# Patient Record
Sex: Female | Born: 1955 | Race: Black or African American | Hispanic: No | Marital: Single | Smoking: Never smoker
Health system: Southern US, Community
[De-identification: ages and names within clinical notes are randomized; demographics above are authoritative.]

## PROBLEM LIST (undated history)

## (undated) DIAGNOSIS — K219 Gastro-esophageal reflux disease without esophagitis: Secondary | ICD-10-CM

## (undated) DIAGNOSIS — E785 Hyperlipidemia, unspecified: Secondary | ICD-10-CM

## (undated) DIAGNOSIS — T7840XA Allergy, unspecified, initial encounter: Secondary | ICD-10-CM

## (undated) DIAGNOSIS — Z22322 Carrier or suspected carrier of Methicillin resistant Staphylococcus aureus: Secondary | ICD-10-CM

## (undated) DIAGNOSIS — C801 Malignant (primary) neoplasm, unspecified: Secondary | ICD-10-CM

## (undated) DIAGNOSIS — I10 Essential (primary) hypertension: Secondary | ICD-10-CM

## (undated) DIAGNOSIS — Z973 Presence of spectacles and contact lenses: Secondary | ICD-10-CM

## (undated) DIAGNOSIS — E669 Obesity, unspecified: Secondary | ICD-10-CM

## (undated) DIAGNOSIS — Z01419 Encounter for gynecological examination (general) (routine) without abnormal findings: Secondary | ICD-10-CM

## (undated) DIAGNOSIS — Z9289 Personal history of other medical treatment: Secondary | ICD-10-CM

## (undated) DIAGNOSIS — E559 Vitamin D deficiency, unspecified: Secondary | ICD-10-CM

## (undated) DIAGNOSIS — Z972 Presence of dental prosthetic device (complete) (partial): Secondary | ICD-10-CM

## (undated) DIAGNOSIS — D509 Iron deficiency anemia, unspecified: Secondary | ICD-10-CM

## (undated) HISTORY — DX: Vitamin D deficiency, unspecified: E55.9

## (undated) HISTORY — DX: Hyperlipidemia, unspecified: E78.5

## (undated) HISTORY — DX: Presence of spectacles and contact lenses: Z97.3

## (undated) HISTORY — PX: OOPHORECTOMY: SHX86

## (undated) HISTORY — PX: TUBAL LIGATION: SHX77

## (undated) HISTORY — DX: Allergy, unspecified, initial encounter: T78.40XA

## (undated) HISTORY — DX: Personal history of other medical treatment: Z92.89

## (undated) HISTORY — DX: Encounter for gynecological examination (general) (routine) without abnormal findings: Z01.419

## (undated) HISTORY — DX: Obesity, unspecified: E66.9

## (undated) HISTORY — DX: Presence of dental prosthetic device (complete) (partial): Z97.2

## (undated) HISTORY — DX: Gastro-esophageal reflux disease without esophagitis: K21.9

## (undated) HISTORY — DX: Essential (primary) hypertension: I10

---

## 2003-12-07 DIAGNOSIS — I10 Essential (primary) hypertension: Secondary | ICD-10-CM

## 2003-12-07 HISTORY — DX: Essential (primary) hypertension: I10

## 2011-09-14 ENCOUNTER — Emergency Department (HOSPITAL_COMMUNITY)
Admission: EM | Admit: 2011-09-14 | Discharge: 2011-09-14 | Disposition: A | Discharge status: Home / Self Care | Payer: No Typology Code available for payment source | Attending: Emergency Medicine | Admitting: Emergency Medicine

## 2011-09-14 ENCOUNTER — Emergency Department (HOSPITAL_COMMUNITY): Payer: No Typology Code available for payment source

## 2011-09-14 DIAGNOSIS — Z79899 Other long term (current) drug therapy: Secondary | ICD-10-CM | POA: Insufficient documentation

## 2011-09-14 DIAGNOSIS — K219 Gastro-esophageal reflux disease without esophagitis: Secondary | ICD-10-CM | POA: Insufficient documentation

## 2011-09-14 DIAGNOSIS — I10 Essential (primary) hypertension: Secondary | ICD-10-CM | POA: Insufficient documentation

## 2011-09-14 DIAGNOSIS — M542 Cervicalgia: Secondary | ICD-10-CM | POA: Insufficient documentation

## 2011-09-14 DIAGNOSIS — R51 Headache: Secondary | ICD-10-CM | POA: Insufficient documentation

## 2011-09-14 DIAGNOSIS — M25519 Pain in unspecified shoulder: Secondary | ICD-10-CM | POA: Insufficient documentation

## 2011-09-14 DIAGNOSIS — E7800 Pure hypercholesterolemia, unspecified: Secondary | ICD-10-CM | POA: Insufficient documentation
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
## 2012-01-07 HISTORY — PX: COLONOSCOPY: SHX174

## 2012-01-11 LAB — HM COLONOSCOPY

## 2014-01-16 ENCOUNTER — Encounter: Payer: Self-pay | Admitting: Medical

## 2014-01-16 ENCOUNTER — Ambulatory Visit (INDEPENDENT_AMBULATORY_CARE_PROVIDER_SITE_OTHER): Payer: 59 | Admitting: Medical

## 2014-01-16 VITALS — BP 102/70 | HR 60 | Temp 98.3°F | Resp 16 | Wt 194.0 lb

## 2014-01-16 DIAGNOSIS — J209 Acute bronchitis, unspecified: Secondary | ICD-10-CM

## 2014-01-16 DIAGNOSIS — H9209 Otalgia, unspecified ear: Secondary | ICD-10-CM

## 2014-01-16 DIAGNOSIS — H9201 Otalgia, right ear: Secondary | ICD-10-CM

## 2014-01-16 MED ORDER — AMOXICILLIN-POT CLAVULANATE 875-125 MG PO TABS: 1.0000 | ORAL_TABLET | Freq: Two times a day (BID) | ORAL | Status: DC

## 2014-01-16 MED ORDER — ANTIPYRINE-BENZOCAINE 5.4-1.4 % OT SOLN: 3.0000 [drp] | OTIC | Status: DC | PRN

## 2014-01-16 MED ORDER — BENZONATATE 200 MG PO CAPS: 200.0000 mg | ORAL_CAPSULE | Freq: Three times a day (TID) | ORAL | Status: DC | PRN

## 2014-01-16 NOTE — Progress Notes (Signed)
Subjective:  Megan Jordan is a 58 y.o. female who presents for possible bronchitis.  New patient today.  Symptoms include several day hx/o right ear pain, chest congestion, lots of cough, burning in chest, chills, fatigue, runny nose, swollen glands, headache.   Denies fever, NVD, SOB, wheezing, hemoptysis, sore throat.  Treatment to date: Robitussin.  +sick contacts.   She does not smoke.   She does have a history of bronchitis.   No other aggravating or relieving factors.  No other c/o.  The following portions of the patient's history were reviewed and updated as appropriate: allergies, current medications, past family history, past medical history, past social history, past surgical history and problem list.  ROS as in subjective   Objective: BP 102/70  Pulse 60  Temp(Src) 98.3 F (36.8 C) (Oral)  Resp 16  Wt 194 lb (87.998 kg)   General appearance: Alert, WD/WN, no distress                             Skin: warm, no rash, no diaphoresis                           Head: mild frontal sinus tenderness                            Eyes: conjunctiva normal, corneas clear, PERRLA                            Ears:flat TMs,no erythema, external ear canals normal                          Nose: septum midline, turbinates swollen, with erythema and clear discharge             Mouth/throat: MMM, tongue normal, mild pharyngeal erythema                           Neck: supple, no adenopathy, no thyromegaly, nontender                          Heart: RRR, normal S1, S2, no murmurs                         Lungs: +bronchial breath sounds, few rhonchi, no wheezes, no rales                Extremities: no edema, nontender, no palpable cord or calve tenderness     Assessment: Encounter Diagnoses  Name Primary?  . Acute bronchitis Yes  . Otalgia of right ear     Plan:   [name] was seen today for otalgia and possible bronchitis.  Diagnoses and associated orders for this visit:  Acute  bronchitis  Otalgia of right ear  Other Orders - antipyrine-benzocaine (AURALGAN) otic solution; Place 3-4 drops into both ears every 2 (two) hours as needed for ear pain. - benzonatate (TESSALON) 200 MG capsule; Take 1 capsule (200 mg total) by mouth 3 (three) times daily as needed for cough. - amoxicillin-clavulanate (AUGMENTIN) 875-125 MG per tablet; Take 1 tablet by mouth 2 (two) times daily.    Discussed diagnosis and treatment.  Suggested symptomatic OTC remedies for cough and congestion.  Tylenol or  Ibuprofen OTC for fever and malaise.  Call/return in 2-3 days if symptoms are worse or not improving.  Advised that cough may linger even after the infection is improved.

## 2014-01-17 ENCOUNTER — Telehealth: Payer: Self-pay | Admitting: Medical

## 2014-01-17 NOTE — Telephone Encounter (Signed)
lm

## 2014-02-05 ENCOUNTER — Encounter: Payer: 59 | Admitting: Medical

## 2014-03-19 ENCOUNTER — Encounter: Payer: 59 | Admitting: Medical

## 2014-03-26 ENCOUNTER — Encounter: Payer: Self-pay | Admitting: Medical

## 2014-03-26 ENCOUNTER — Telehealth: Payer: Self-pay | Admitting: Internal Medicine

## 2014-03-26 ENCOUNTER — Ambulatory Visit (INDEPENDENT_AMBULATORY_CARE_PROVIDER_SITE_OTHER): Payer: 59 | Admitting: Medical

## 2014-03-26 VITALS — BP 110/70 | HR 60 | Temp 98.0°F | Resp 16 | Ht 67.0 in | Wt 195.0 lb

## 2014-03-26 DIAGNOSIS — I10 Essential (primary) hypertension: Secondary | ICD-10-CM

## 2014-03-26 DIAGNOSIS — E785 Hyperlipidemia, unspecified: Secondary | ICD-10-CM

## 2014-03-26 DIAGNOSIS — K219 Gastro-esophageal reflux disease without esophagitis: Secondary | ICD-10-CM

## 2014-03-26 DIAGNOSIS — Z0000 Encounter for general adult medical examination without abnormal findings: Secondary | ICD-10-CM

## 2014-03-26 DIAGNOSIS — E669 Obesity, unspecified: Secondary | ICD-10-CM

## 2014-03-26 LAB — POCT URINALYSIS DIPSTICK
Bilirubin, UA: NEGATIVE
Blood, UA: NEGATIVE
Glucose, UA: NEGATIVE
Ketones, UA: NEGATIVE
Leukocytes, UA: NEGATIVE
Nitrite, UA: NEGATIVE
Protein, UA: NEGATIVE
Spec Grav, UA: <=1.005
Urobilinogen, UA: NEGATIVE
pH, UA: 5

## 2014-03-26 LAB — CBC
HCT: 34.8 % — ABNORMAL LOW (ref 36.0–46.0)
Hemoglobin: 11.8 g/dL — ABNORMAL LOW (ref 12.0–15.0)
MCH: 28.1 pg (ref 26.0–34.0)
MCHC: 33.9 g/dL (ref 30.0–36.0)
MCV: 82.9 fL (ref 78.0–100.0)
Platelets: 232 10*3/uL (ref 150–400)
RBC: 4.2 MIL/uL (ref 3.87–5.11)
RDW: 13.8 % (ref 11.5–15.5)
WBC: 6.1 10*3/uL (ref 4.0–10.5)

## 2014-03-26 LAB — LIPID PANEL
Cholesterol: 152 mg/dL (ref 0–200)
HDL: 67 mg/dL (ref >39)
LDL Cholesterol: 75 mg/dL (ref 0–99)
Total CHOL/HDL Ratio: 2.3 Ratio
Triglycerides: 50 mg/dL (ref <150)
VLDL: 10 mg/dL (ref 0–40)

## 2014-03-26 LAB — COMPREHENSIVE METABOLIC PANEL
ALT: 21 U/L (ref 0–35)
AST: 22 U/L (ref 0–37)
Albumin: 4.2 g/dL (ref 3.5–5.2)
Alkaline Phosphatase: 62 U/L (ref 39–117)
BUN: 16 mg/dL (ref 6–23)
CO2: 28 mEq/L (ref 19–32)
Calcium: 9.2 mg/dL (ref 8.4–10.5)
Chloride: 100 mEq/L (ref 96–112)
Creat: 0.8 mg/dL (ref 0.50–1.10)
Glucose, Bld: 79 mg/dL (ref 70–99)
Potassium: 4.1 mEq/L (ref 3.5–5.3)
Sodium: 136 mEq/L (ref 135–145)
Total Bilirubin: 0.3 mg/dL (ref 0.2–1.2)
Total Protein: 7.3 g/dL (ref 6.0–8.3)

## 2014-03-26 MED ORDER — VITAMIN D (ERGOCALCIFEROL) 1.25 MG (50000 UNIT) PO CAPS: 50000.0000 [IU] | ORAL_CAPSULE | ORAL | Status: DC

## 2014-03-26 MED ORDER — OMEPRAZOLE 20 MG PO CPDR: 20.0000 mg | DELAYED_RELEASE_CAPSULE | Freq: Every day | ORAL | Status: DC

## 2014-03-26 MED ORDER — LISINOPRIL-HYDROCHLOROTHIAZIDE 20-12.5 MG PO TABS: 1.0000 | ORAL_TABLET | Freq: Every day | ORAL | Status: DC

## 2014-03-26 NOTE — Telephone Encounter (Signed)
Faxed over medical records to [facility] @ [phone] and Dr. Matthew Saras @ [phone]

## 2014-03-26 NOTE — Patient Instructions (Signed)

## 2014-03-26 NOTE — Progress Notes (Signed)
Subjective:   HPI  Megan Jordan is a 58 y.o. female who presents for a complete physical.  Medical care team includes:  Gynecology, Dr. Matthew Saras  Opthalmology - [facility]  Dentist, Dr. Carmelina Noun, PA-C, primary care   Preventative care: Last ophthalmology visit:YES/ [facility]/ 10/2013 Last dental visit:YES Dr. Gerlene Burdock Last colonoscopy:01/2012 Last mammogram:11/2013 Last gynecological exam:11/2013 Last EKG:YES Last labs:?  Prior vaccinations: TD or Tdap:WITH IN 5 YEARS Influenza:09/2013 Pneumococcal:09/2013 Shingles/Zostavax:N/A  Advanced directive:N/A Health care power of attorney:N/A Living will:N/A  Concerns: None, compliaint with medications without c/o for BP, cholesterol.  Reviewed their medical, surgical, family, social, medication, and allergy history and updated chart as appropriate.    Review of Systems Constitutional: -fever, -chills, -sweats, -unexpected weight change, -decreased appetite, -fatigue Allergy: -sneezing, -itching, -congestion Dermatology: -changing moles, --rash, -lumps ENT: -runny nose, -ear pain, -sore throat, -hoarseness, -sinus pain, -teeth pain, - ringing in ears, -hearing loss, -nosebleeds Cardiology: -chest pain, -palpitations, -swelling, -difficulty breathing when lying flat, -waking up short of breath Respiratory: -cough, -shortness of breath, -difficulty breathing with exercise or exertion, -wheezing, -coughing up blood Gastroenterology: -abdominal pain, -nausea, -vomiting, -diarrhea, -constipation, -blood in stool, -changes in bowel movement, -difficulty swallowing or eating Hematology: -bleeding, -bruising  Musculoskeletal: -joint aches, -muscle aches, -joint swelling, -back pain, -neck pain, -cramping, -changes in gait Ophthalmology: denies vision changes, eye redness, itching, discharge Urology: -burning with urination, -difficulty urinating, -blood in urine, -urinary frequency, -urgency,  -incontinence Neurology: -headache, -weakness, -tingling, -numbness, -memory loss, -falls, -dizziness Psychology: -depressed mood, -agitation, -sleep problems     Objective:   Physical Exam  BP 110/70  Pulse 60  Temp(Src) 98 F (36.7 C) (Oral)  Resp 16  Ht 5\' 7"  (1.702 m)  Wt 195 lb (88.451 kg)  BMI 30.53 kg/m2   General appearance: alert, no distress, WD/WN, AA female Skin: scattered benign appearing macules, no worrisome lesions HEENT: normocephalic, conjunctiva/corneas normal, sclerae anicteric, PERRLA, EOMi, nares patent, no discharge or erythema, pharynx normal Oral cavity: MMM, tongue normal, teeth in good repair Neck: supple, no lymphadenopathy, no thyromegaly, no masses, normal ROM, no bruits Chest: non tender, normal shape and expansion Heart: RRR, normal S1, S2, no murmurs Lungs: CTA bilaterally, no wheezes, rhonchi, or rales Abdomen: +bs, soft, non tender, non distended, no masses, no hepatomegaly, no splenomegaly, no bruits Back: linear small scar right low back, otherwise non tender, normal ROM, no scoliosis Musculoskeletal: upper extremities non tender, no obvious deformity, normal ROM throughout, lower extremities non tender, no obvious deformity, normal ROM throughout Extremities: no edema, no cyanosis, no clubbing Pulses: 2+ symmetric, upper and lower extremities, normal cap refill Neurological: alert, oriented x 3, CN2-12 intact, strength normal upper extremities and lower extremities, sensation normal throughout, DTRs 2+ throughout, no cerebellar signs, gait normal Psychiatric: normal affect, behavior normal, pleasant  Breast/gyn/rectal - deferred to gyn   Assessment and Plan :    Encounter Diagnoses  Name Primary?  . Routine general medical examination at a health care facility Yes  . Essential hypertension, benign   . Hyperlipidemia   . Obesity, unspecified   . GERD (gastroesophageal reflux disease)       Physical exam - discussed healthy  lifestyle, diet, exercise, preventative care, vaccinations, and addressed their concerns.  Handout given. HTN - controlled on current medication Hyperlipidemia-labs today, refill pending labs Obesity - need to work on weight loss changes GERD - controlled on current medication. Follow-up pending labs

## 2014-03-27 ENCOUNTER — Other Ambulatory Visit: Payer: Self-pay | Admitting: Medical

## 2014-03-27 DIAGNOSIS — D649 Anemia, unspecified: Secondary | ICD-10-CM

## 2014-03-27 LAB — HIV ANTIBODY (ROUTINE TESTING W REFLEX): HIV 1&2 Ab, 4th Generation: NONREACTIVE

## 2014-03-27 MED ORDER — PRAVASTATIN SODIUM 80 MG PO TABS: 80.0000 mg | ORAL_TABLET | Freq: Every day | ORAL | Status: DC

## 2014-04-09 ENCOUNTER — Telehealth: Payer: Self-pay | Admitting: Family Medicine

## 2014-04-09 ENCOUNTER — Encounter: Payer: Self-pay | Admitting: Internal Medicine

## 2014-04-09 NOTE — Telephone Encounter (Signed)
Patient is not due for a colonoscopy until 01/2017 per Dr. Lavetta Nielsen office. Her last colonoscopy was 01/2012. They will fax over the colonoscopy report to you. CLS

## 2014-04-11 ENCOUNTER — Encounter: Payer: Self-pay | Admitting: Medical

## 2014-04-11 ENCOUNTER — Encounter: Payer: Self-pay | Admitting: Internal Medicine

## 2015-03-31 ENCOUNTER — Telehealth: Payer: Self-pay | Admitting: Medical

## 2015-03-31 ENCOUNTER — Ambulatory Visit (INDEPENDENT_AMBULATORY_CARE_PROVIDER_SITE_OTHER): Payer: 59 | Admitting: Medical

## 2015-03-31 ENCOUNTER — Encounter: Payer: Self-pay | Admitting: Medical

## 2015-03-31 VITALS — BP 110/78 | HR 78 | Temp 98.4°F | Resp 15 | Ht 62.0 in | Wt 194.0 lb

## 2015-03-31 DIAGNOSIS — E669 Obesity, unspecified: Secondary | ICD-10-CM | POA: Diagnosis not present

## 2015-03-31 DIAGNOSIS — E785 Hyperlipidemia, unspecified: Secondary | ICD-10-CM

## 2015-03-31 DIAGNOSIS — I10 Essential (primary) hypertension: Secondary | ICD-10-CM | POA: Diagnosis not present

## 2015-03-31 DIAGNOSIS — Z0000 Encounter for general adult medical examination without abnormal findings: Secondary | ICD-10-CM

## 2015-03-31 LAB — CBC
HEMATOCRIT: 35.5 % — AB (ref 36.0–46.0)
HEMOGLOBIN: 11.7 g/dL — AB (ref 12.0–15.0)
MCH: 28.1 pg (ref 26.0–34.0)
MCHC: 33 g/dL (ref 30.0–36.0)
MCV: 85.1 fL (ref 78.0–100.0)
MPV: 9.7 fL (ref 8.6–12.4)
Platelets: 229 10*3/uL (ref 150–400)
RBC: 4.17 MIL/uL (ref 3.87–5.11)
RDW: 13.7 % (ref 11.5–15.5)
WBC: 5.1 10*3/uL (ref 4.0–10.5)

## 2015-03-31 LAB — LIPID PANEL
CHOL/HDL RATIO: 2.1 ratio
CHOLESTEROL: 161 mg/dL (ref 0–200)
HDL: 78 mg/dL (ref >=46)
LDL Cholesterol: 73 mg/dL (ref 0–99)
Triglycerides: 51 mg/dL (ref <150)
VLDL: 10 mg/dL (ref 0–40)

## 2015-03-31 LAB — COMPREHENSIVE METABOLIC PANEL
ALBUMIN: 4.1 g/dL (ref 3.5–5.2)
ALT: 22 U/L (ref 0–35)
AST: 23 U/L (ref 0–37)
Alkaline Phosphatase: 64 U/L (ref 39–117)
BUN: 15 mg/dL (ref 6–23)
CHLORIDE: 102 meq/L (ref 96–112)
CO2: 25 meq/L (ref 19–32)
CREATININE: 0.72 mg/dL (ref 0.50–1.10)
Calcium: 9.2 mg/dL (ref 8.4–10.5)
GLUCOSE: 87 mg/dL (ref 70–99)
POTASSIUM: 4 meq/L (ref 3.5–5.3)
SODIUM: 138 meq/L (ref 135–145)
Total Bilirubin: 0.3 mg/dL (ref 0.2–1.2)
Total Protein: 7.3 g/dL (ref 6.0–8.3)

## 2015-03-31 LAB — POCT URINALYSIS DIPSTICK
Bilirubin, UA: NEGATIVE
Blood, UA: NEGATIVE
Glucose, UA: NEGATIVE
KETONES UA: NEGATIVE
Leukocytes, UA: NEGATIVE
Nitrite, UA: NEGATIVE
PROTEIN UA: NEGATIVE
SPEC GRAV UA: 1.015
Urobilinogen, UA: NEGATIVE
pH, UA: 6

## 2015-03-31 LAB — HEMOGLOBIN A1C
HEMOGLOBIN A1C: 6.4 % — AB (ref <5.7)
MEAN PLASMA GLUCOSE: 137 mg/dL — AB (ref <117)

## 2015-03-31 NOTE — Addendum Note (Signed)
Addended by: Carlena Hurl on: 03/31/2015 10:57 AM   Modules accepted: Orders

## 2015-03-31 NOTE — Addendum Note (Signed)
Addended by: Carlena Hurl on: 03/31/2015 11:23 AM   Modules accepted: Orders

## 2015-03-31 NOTE — Telephone Encounter (Signed)
Please abstract historical vaccines

## 2015-03-31 NOTE — Progress Notes (Signed)
Subjective:   HPI  Megan Jordan is a 59 y.o. female who presents for a complete physical.   Preventative care: Last ophthalmology visit: YES - [facility]. Last dental visit: YES DR. CHEW Last colonoscopy: 2013 Last mammogram: 12/2014 Last gynecological exam:1/ 2016 WITH DR. Matthew Saras Last EKG:N/A Last labs:4/ 2015  Prior vaccinations: TD or Tdap: WITH IN 5 YEARS Influenza:09/2014 Pneumococcal: 09/2014 Shingles/Zostavax: ?  Concerns: None, feels fine  Reviewed their medical, surgical, family, social, medication, and allergy history and updated chart as appropriate.  Past Medical History  Diagnosis Date  . Hyperlipidemia   . GERD (gastroesophageal reflux disease)   . Allergy   . Wears glasses   . Wears partial dentures   . History of mammogram     Dr. Matthew Saras  . Routine gynecological examination     Dr. Matthew Saras  . Hypertension 2005  . Vitamin D deficiency   . Obesity     Past Surgical History  Procedure Laterality Date  . Oophorectomy      right  . Colonoscopy  01/2012    Dr. Paulita Fujita with [facility]    History   Social History  . Marital Status: Single    Spouse Name: N/A  . Number of Children: N/A  . Years of Education: N/A   Occupational History  . Not on file.   Social History Main Topics  . Smoking status: Never Smoker   . Smokeless tobacco: Not on file  . Alcohol Use: No  . Drug Use: No  . Sexual Activity: Not on file   Other Topics Concern  . Not on file   Social History Narrative   Single, happy, exercise - walks 3-4 times per week, stretching, going to start some weight bearing exercise, works as Equities trader at [employer], has 2 daughters in [place], [place]; 12/7406.    Family History  Problem Relation Age of Onset  . Cancer Mother 50    colon  . Hypertension Mother   . Other Father     unknown  . Diabetes Neg Hx   . Heart disease Neg Hx   . Stroke Neg Hx   . Other Sister     died of MVA  .  Aneurysm Sister      Current outpatient prescriptions:  .  aspirin 81 MG tablet, Take 81 mg by mouth daily., Disp: , Rfl:  .  lisinopril-hydrochlorothiazide (PRINZIDE,ZESTORETIC) 20-12.5 MG per tablet, Take 1 tablet by mouth daily., Disp: 90 tablet, Rfl: 3 .  Multiple Vitamins-Minerals (MULTIVITAMIN WITH MINERALS) tablet, Take 1 tablet by mouth daily., Disp: , Rfl:  .  omeprazole (PRILOSEC) 20 MG capsule, Take 1 capsule (20 mg total) by mouth daily., Disp: 90 capsule, Rfl: 3 .  pravastatin (PRAVACHOL) 80 MG tablet, Take 1 tablet (80 mg total) by mouth daily., Disp: 90 tablet, Rfl: 3 .  Vitamin D, Ergocalciferol, (DRISDOL) 50000 UNITS CAPS capsule, Take 1 capsule (50,000 Units total) by mouth every 7 (seven) days. (Patient not taking: Reported on 03/31/2015), Disp: 4.5 capsule, Rfl: 3  Allergies  Allergen Reactions  . Zocor [Simvastatin]     Abdominal pain  . Celebrex [Celecoxib] Rash    No rash with other NSAIDs  . Prevacid [Lansoprazole] Rash    Tolerates other PPIs and H2 blockers    Review of Systems Constitutional: -fever, -chills, -sweats, -unexpected weight change, -decreased appetite, -fatigue Allergy: -sneezing, +itching eye, -congestion, + eye redness Dermatology: -changing moles, --rash, -lumps ENT: -runny nose, -ear pain, -  sore throat, -hoarseness, -sinus pain, -teeth pain, - ringing in ears, -hearing loss, -nosebleeds Cardiology: -chest pain, -palpitations, -swelling, -difficulty breathing when lying flat, -waking up short of breath Respiratory: -cough, -shortness of breath, -difficulty breathing with exercise or exertion, -wheezing, -coughing up blood Gastroenterology: -abdominal pain, -nausea, -vomiting, -diarrhea, -constipation, -blood in stool, -changes in bowel movement, -difficulty swallowing or eating Hematology: -bleeding, -bruising  Musculoskeletal: -joint aches, -muscle aches, -joint swelling, -back pain, -neck pain, -cramping, -changes in gait Ophthalmology:  denies vision changes, eye redness, itching, discharge Urology: -burning with urination, -difficulty urinating, -blood in urine, -urinary frequency, -urgency, -incontinence Neurology: -headache, -weakness, -tingling, -numbness, -memory loss, -falls, -dizziness Psychology: -depressed mood, -agitation, -sleep problems     Objective:   Physical Exam  BP 110/78 mmHg  Pulse 78  Temp(Src) 98.4 F (36.9 C) (Oral)  Resp 15  Ht 5\' 2"  (1.575 m)  Wt 194 lb (87.998 kg)  BMI 35.47 kg/m2  General appearance: alert, no distress, WD/WN, AA female Skin: scattered benign appearing macules, no worrisome lesions HEENT: normocephalic, conjunctiva/corneas normal, sclerae anicteric, PERRLA, EOMi, nares patent, no discharge or erythema, pharynx normal Oral cavity: MMM, tongue normal, teeth in good repair, upper denture Neck: supple, no lymphadenopathy, no thyromegaly, no masses, normal ROM, no bruits Chest: non tender, normal shape and expansion Heart: RRR, normal S1, S2, no murmurs Lungs: CTA bilaterally, no wheezes, rhonchi, or rales Abdomen: +bs, soft, non tender, non distended, no masses, no hepatomegaly, no splenomegaly, no bruits Back: linear small scar right low back, otherwise non tender, normal ROM, no scoliosis Musculoskeletal: upper extremities non tender, no obvious deformity, normal ROM throughout, lower extremities non tender, no obvious deformity, normal ROM throughout Extremities: no edema, no cyanosis, no clubbing Pulses: 2+ symmetric, upper and lower extremities, normal cap refill Neurological: alert, oriented x 3, CN2-12 intact, strength normal upper extremities and lower extremities, sensation normal throughout, DTRs 2+ throughout, no cerebellar signs, gait normal Psychiatric: normal affect, behavior normal, pleasant  Breast/gyn/rectal - deferred to gyn   Assessment and Plan :    Encounter Diagnoses  Name Primary?  . Encounter for health maintenance examination in adult Yes  .  Essential hypertension   . Hyperlipidemia   . Obesity    Physical exam - discussed healthy lifestyle, diet, exercise, preventative care, vaccinations, and addressed their concerns.  Handout given. See your eye doctor yearly for routine vision care. See your dentist yearly for routine dental care including hygiene visits twice yearly. See your gynecologist yearly for routine gynecological care. HTN - labs today, c/t same medication, advised 10 lb weight loss since she wants to try and come off some medication.  discussed diet, exercise Hyperlipidemia - labs today, c/t same medication Obesity - lifestyle changes and weight loss advised Will once again request copy of colonoscopy and prior EKG.  She declines EKG today. Follow-up pending labs

## 2015-04-01 ENCOUNTER — Other Ambulatory Visit: Payer: Self-pay | Admitting: Medical

## 2015-04-01 DIAGNOSIS — Z0000 Encounter for general adult medical examination without abnormal findings: Secondary | ICD-10-CM

## 2015-04-01 LAB — VITAMIN D 25 HYDROXY (VIT D DEFICIENCY, FRACTURES): Vit D, 25-Hydroxy: 25 ng/mL — ABNORMAL LOW (ref 30–100)

## 2015-04-01 LAB — MICROALBUMIN / CREATININE URINE RATIO
CREATININE, URINE: 47.1 mg/dL
Microalb, Ur: <0.2 mg/dL (ref <2.0)

## 2015-04-01 MED ORDER — OMEPRAZOLE 20 MG PO CPDR: 20.0000 mg | DELAYED_RELEASE_CAPSULE | Freq: Every day | ORAL | Status: DC

## 2015-04-01 MED ORDER — VITAMIN D (ERGOCALCIFEROL) 1.25 MG (50000 UNIT) PO CAPS: 50000.0000 [IU] | ORAL_CAPSULE | ORAL | Status: DC

## 2015-04-01 MED ORDER — PRAVASTATIN SODIUM 80 MG PO TABS: 80.0000 mg | ORAL_TABLET | Freq: Every day | ORAL | Status: DC

## 2015-04-01 MED ORDER — LISINOPRIL-HYDROCHLOROTHIAZIDE 20-12.5 MG PO TABS: 1.0000 | ORAL_TABLET | Freq: Every day | ORAL | Status: DC

## 2015-04-01 MED ORDER — ASPIRIN 81 MG PO TABS: 81.0000 mg | ORAL_TABLET | Freq: Every day | ORAL | Status: DC

## 2015-04-01 NOTE — Telephone Encounter (Signed)
VACCINES ENTERED IN THE SYSTEM

## 2015-04-08 ENCOUNTER — Encounter: Payer: Self-pay | Admitting: Medical

## 2015-05-14 ENCOUNTER — Ambulatory Visit: Payer: Self-pay | Admitting: Dietician

## 2015-07-17 ENCOUNTER — Telehealth: Payer: Self-pay

## 2015-07-17 NOTE — Telephone Encounter (Signed)
Received fax from [facility] for Omeprazole DR 20mg  #90

## 2015-07-18 ENCOUNTER — Other Ambulatory Visit: Payer: Self-pay | Admitting: Medical

## 2015-07-18 MED ORDER — OMEPRAZOLE 20 MG PO CPDR: 20.0000 mg | DELAYED_RELEASE_CAPSULE | Freq: Every day | ORAL | Status: DC

## 2015-07-18 NOTE — Telephone Encounter (Signed)
Left message for patient re: this.

## 2015-07-18 NOTE — Telephone Encounter (Signed)
See message from last visit, needs to do stool cards x 3.

## 2015-07-18 NOTE — Telephone Encounter (Signed)
This needs to be clinical

## 2015-07-21 ENCOUNTER — Encounter: Payer: 59 | Attending: Medical | Admitting: Dietician

## 2015-07-21 ENCOUNTER — Encounter: Payer: Self-pay | Admitting: Dietician

## 2015-07-21 VITALS — Ht 62.0 in | Wt 192.0 lb

## 2015-07-21 DIAGNOSIS — Z6835 Body mass index (BMI) 35.0-35.9, adult: Secondary | ICD-10-CM | POA: Insufficient documentation

## 2015-07-21 DIAGNOSIS — E669 Obesity, unspecified: Secondary | ICD-10-CM | POA: Diagnosis not present

## 2015-07-21 DIAGNOSIS — Z713 Dietary counseling and surveillance: Secondary | ICD-10-CM | POA: Insufficient documentation

## 2015-07-21 DIAGNOSIS — R7303 Prediabetes: Secondary | ICD-10-CM

## 2015-07-21 NOTE — Progress Notes (Signed)
  Medical Nutrition Therapy:  Appt start time: 0800 end time:  0900.   Assessment:  Primary concerns today: Patient is here alone.  She would like to lose 30 lbs and learn more about any needed changes for health and wellness.  Hx includes GERD, HTN, hyperlipidemia, and Vitamin D deficiency.  HgbA1C 6.4% (03/31/15) but "I don't accept that."  She states that she had had honey and other sweets prior to the test.  Patient is resistant to discussing this further but on her own has decided to avoid added honey and sugar.  Patient lives alone.  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  She often only gets 4-5 hours of sleep per day.  She started throwing her snacks away (cheese its and chips).  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  She states that she eats slowly but often due to tired or bored.  She enjoys doing things with her grandchildren.  She has moved here from [facility] about 1-2 years ago.  She wants to begin swimming with her grandchildren at the [facility].  Preferred Learning Style:   No preference indicated   Learning Readiness:   Not ready  Contemplating  Ready  Change in progress  MEDICATIONS: see list   DIETARY INTAKE: 3 meals and 1 snack daily.  Eats at [employer] occasionally.    24-hr recall:  B (AM): sweet roll and piece of bacon OR cheerios and 2% lactose free milk   Snk ( AM):   L (3-4 PM): leftovers (greens, chicken, potato salad) Snk ( PM): recently threw snacks away (chips/cheese its) D ( 12AM): leftovers, salad, sandwich, soup Snk ( PM): trail mix or fruit Beverages: water or flavored water, diluted sweet tea, uses stevia at times  Usual physical activity: walks 1-1 1/2 hours 4 times per week at [employer] (outside).  Estimated energy needs: 1500 calories 170 g carbohydrates 112 g protein 42 g fat  Progress Towards Goal(s):  In  progress.   Nutritional Diagnosis:  NB-1.1 Food and nutrition-related knowledge deficit As related to general nutrition for weight loss.  As evidenced by patient report.    Intervention:  Nutrition Counseling regarding weight management and healthy eating/lifestyle to improve blood sugar.  Patient is resistant to the prediabetes diagnosis but is willing to make healthy lifestyle changes.  She is eligable for the [employer] program and will also consider this.  Discussed mindful eating and portions size using food models.  Will e-mail the Chef at [facility] to see if there are options for heathy meals at night.  Take care of yourself.  Aim to increase the amount of sleep to 7 hours. Continue your exercise routine. You are doing a great job!  Consider adding swimming. Be mindful about your portion sizes and hunger.  Am I hungry? Or just sleepy or board?  Consider increasing your non-starchy vegetable intake.  Teaching Method Utilized:  Visual Auditory Hands on  Handouts given during visit include:  Tips for weight loss  Low cost exercise options in the [place]  Snack list  Label reading  Barriers to learning/adherence to lifestyle change: none  Demonstrated degree of understanding via:  Teach Back   Monitoring/Evaluation:  Dietary intake, exercise, label reading, and body weight in 6 week(s).

## 2015-07-21 NOTE — Patient Instructions (Addendum)
Take care of yourself.  Aim to increase the amount of sleep to 7 hours. Continue your exercise routine. You are doing a great job!  Consider adding swimming. Be mindful about your portion sizes and hunger.  Am I hungry? Or just sleepy or board?  Consider increasing your non-starchy vegetable intake.

## 2015-07-22 ENCOUNTER — Telehealth: Payer: Self-pay | Admitting: Dietician

## 2015-07-22 NOTE — Telephone Encounter (Signed)
Called patient to discuss alternative meal options at her work.  She was unavailable at this time.  Left a message with info on those meal options, how to obtain them and contact information for the food service director there.  She can also call me at any time.  Antonieta Iba, RD, LDN

## 2015-09-01 ENCOUNTER — Ambulatory Visit: Payer: 59 | Admitting: Dietician

## 2015-10-13 ENCOUNTER — Ambulatory Visit (INDEPENDENT_AMBULATORY_CARE_PROVIDER_SITE_OTHER): Payer: 59 | Admitting: Family Medicine

## 2015-10-13 ENCOUNTER — Encounter: Payer: Self-pay | Admitting: Family Medicine

## 2015-10-13 VITALS — BP 128/82 | HR 65 | Temp 98.3°F | Wt 197.8 lb

## 2015-10-13 DIAGNOSIS — R059 Cough, unspecified: Secondary | ICD-10-CM

## 2015-10-13 DIAGNOSIS — R05 Cough: Secondary | ICD-10-CM | POA: Diagnosis not present

## 2015-10-13 MED ORDER — BENZONATATE 200 MG PO CAPS: 200.0000 mg | ORAL_CAPSULE | Freq: Two times a day (BID) | ORAL | Status: DC | PRN

## 2015-10-13 MED ORDER — AMOXICILLIN-POT CLAVULANATE 875-125 MG PO TABS: 1.0000 | ORAL_TABLET | Freq: Two times a day (BID) | ORAL | Status: DC

## 2015-10-13 NOTE — Progress Notes (Signed)
   Subjective:    Patient ID: Megan Jordan, female    DOB: 10/16/1956, 59 y.o.   MRN: [id number]  HPI Chief Complaint  Patient presents with  . sick    chest hurts when she coughs and hurts to breath.. believes its bronchitits. taking robtussion, pain meds to help with this.    She is here for a 4 day history of dry cough, chest tightness and burning with cough and deep inspiration, mild sore throat, nasal congestion, runny nose. She has been using Tylenol with codeine at night, states she had this medication leftover from 3 years ago. Also using Robitussin DM and states this is helping with cough. She states "I've had bronchitis before and I know I have it now".  She is not a smoker. 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  Denies fever, shortness of breath. She is immunocompetent.   Reviewed allergies, medications, past medical, social history.  Review of Systems Pertinent positives and negatives in the history of present illness.    Objective:   Physical Exam  Constitutional: She is oriented to person, place, and time. She appears well-developed and well-nourished. No distress.  HENT:  Right Ear: Tympanic membrane and ear canal normal.  Left Ear: Tympanic membrane and ear canal normal.  Nose: Nose normal.  Mouth/Throat: Uvula is midline, oropharynx is clear and moist and mucous membranes are normal. No oropharyngeal exudate, posterior oropharyngeal edema or posterior oropharyngeal erythema.  Neck: Normal range of motion. Neck supple.  Cardiovascular: Normal rate, regular rhythm and normal heart sounds.   Pulmonary/Chest: Effort normal and breath sounds normal. She has no decreased breath sounds. She has no wheezes. She has no rhonchi.  Lymphadenopathy:       Head (right side): No submandibular, no tonsillar and no occipital adenopathy present.       Head (left side): No submandibular, no tonsillar and no occipital adenopathy present.    She has no cervical adenopathy.   Right: No supraclavicular adenopathy present.       Left: No supraclavicular adenopathy present.  Neurological: She is alert and oriented to person, place, and time.  Skin: Skin is warm and dry. No cyanosis. No pallor.   No cough during assessment.  BP 128/82 mmHg  Pulse 65  Temp(Src) 98.3 F (36.8 C) (Oral)  Wt 197 lb 12.8 oz (89.721 kg)  SpO2 96%      Assessment & Plan:  Cough  Patient expressed her dissatisfaction that I did not recommend an antibiotic today. Discussed that antibiotics are not recommended for bronchitis. Discussed symptomatic treatment such as staying well hydrated, tylenol or ibuprofen for body aches, low grade fever. Discussed inappropriate antibiotic use. Antibiotic prescription sent to pharmacy with instructions to do watchful waiting to see if her symptoms worsen or if course of illness lasts more than 7-10 days. Tessalon prescription sent to pharmacy.

## 2015-10-13 NOTE — Patient Instructions (Signed)

## 2015-10-14 ENCOUNTER — Telehealth: Payer: Self-pay | Admitting: Family Medicine

## 2015-10-14 ENCOUNTER — Ambulatory Visit (HOSPITAL_COMMUNITY)
Admission: RE | Admit: 2015-10-14 | Discharge: 2015-10-14 | Disposition: A | Discharge status: Home / Self Care | Payer: 59 | Source: Ambulatory Visit | Attending: Family Medicine | Admitting: Family Medicine

## 2015-10-14 DIAGNOSIS — R509 Fever, unspecified: Secondary | ICD-10-CM

## 2015-10-14 DIAGNOSIS — R911 Solitary pulmonary nodule: Secondary | ICD-10-CM | POA: Insufficient documentation

## 2015-10-14 DIAGNOSIS — R059 Cough, unspecified: Secondary | ICD-10-CM

## 2015-10-14 DIAGNOSIS — R05 Cough: Secondary | ICD-10-CM | POA: Insufficient documentation

## 2015-10-14 IMAGING — CR DG CHEST 2V
2 series · 2 of 2 positions shown · non-contrast
Comparison: None.

CLINICAL DATA: Cough, fever, congestion.

EXAM:
CHEST  2 VIEW

[w chest pa]
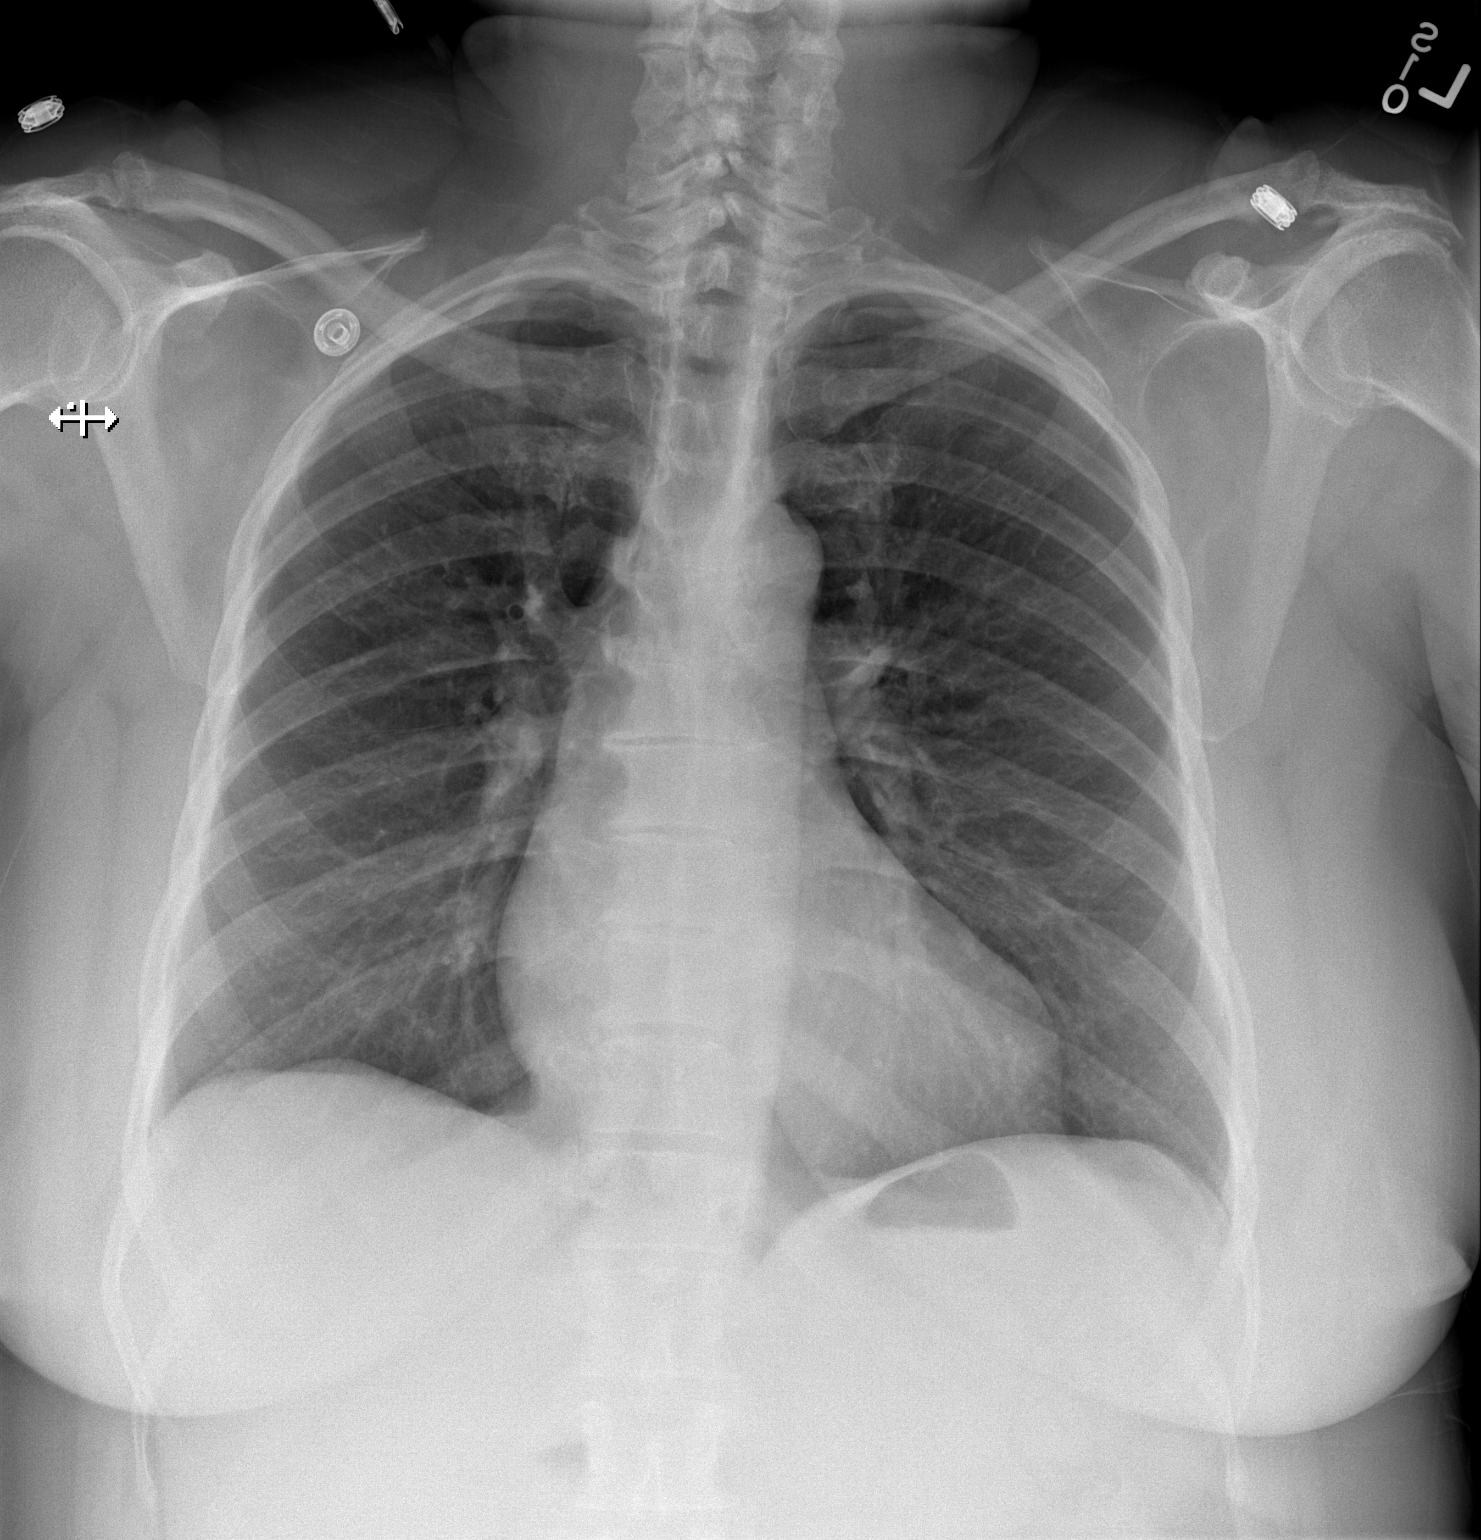

[w chest lat]
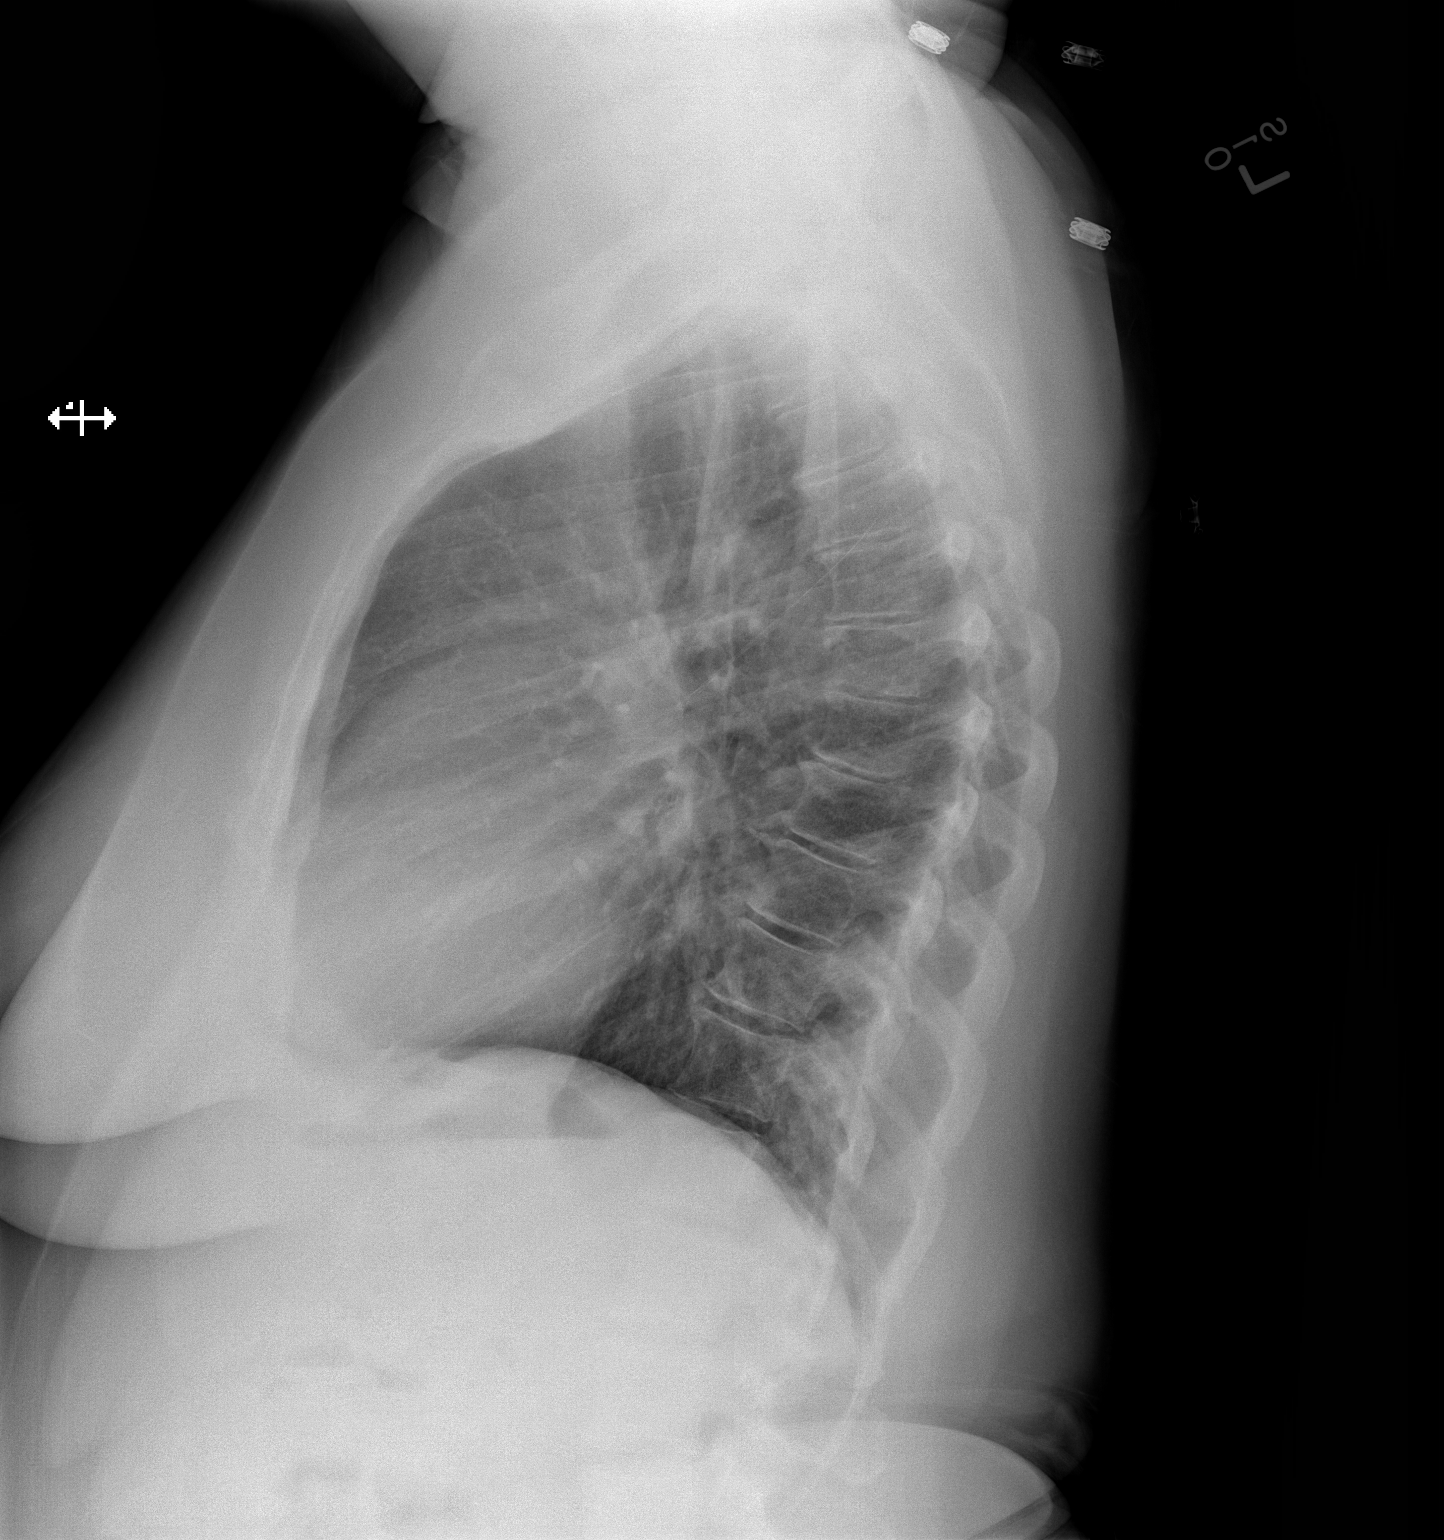

[2 of 2 positions shown; findings below may reference images not displayed]

FINDINGS: Normal mediastinum and cardiac silhouette. Normal pulmonary
vasculature. No evidence of effusion, infiltrate, or pneumothorax. 6
mm nodule projects over the LEFT upper lobe. No acute bony
abnormality.
IMPRESSION: No acute cardiopulmonary process.

6 mm nodule projects over the LEFT upper lobe. Recommend nonemergent
noncontrast CT for confirmation.

## 2015-10-14 NOTE — Telephone Encounter (Signed)
Please call and find out if she has any new symptoms or now has a fever. I'm ok sending her for chest x-ray if she feels like she is getting worse. If I recall she has only been sick for 2 or 3 days however, she may want to give this more time unless she is much worse. Please order a 2-view Chest XR if appropriate.

## 2015-10-14 NOTE — Telephone Encounter (Signed)
Pt says she has not had any improvement. Still having issues with her chest and med is not helping. Pt says Vickie mentioned that pt may need chest xray an pt feels that she may need that now.

## 2015-10-14 NOTE — Telephone Encounter (Signed)
Left message for pt to call me back 

## 2015-10-14 NOTE — Telephone Encounter (Signed)
Pt states she has a fever and chest is hurting and has a spot that is swollen. Pt wants to go to [facility] for her xray. i have put the order into and told her she could go to the radiology dept to get the xray done and when we got the results we would call her

## 2015-10-14 NOTE — Telephone Encounter (Signed)
Megan Jordan is aware of pt's new symtptoms. Waiting for results of xray and go from there

## 2015-10-15 ENCOUNTER — Telehealth: Payer: Self-pay | Admitting: Internal Medicine

## 2015-10-15 DIAGNOSIS — R911 Solitary pulmonary nodule: Secondary | ICD-10-CM

## 2015-10-15 NOTE — Telephone Encounter (Signed)
-----   Message from Girtha Rm, NP sent at 10/15/2015  8:56 AM EST ----- Please call and let her know that her chest x-ray was normal for pneumonia or anything acute. However, they did see a tiny nodule on one lung that they recommend getting a CT scan to confirm that this is actually there or determine what it is. I would not worry about this, but I do recommend going for a CT of her chest at her convenience. We will order this for her and follow up.

## 2015-10-15 NOTE — Telephone Encounter (Signed)
Pt is scheduled at [facility] long radiology tomorrow 10/16/15 @ 1:30pm no prior Josem Kaufmann is needed. Pt is aware of appt

## 2015-10-16 ENCOUNTER — Ambulatory Visit (HOSPITAL_COMMUNITY): Payer: 59

## 2015-10-16 ENCOUNTER — Ambulatory Visit (HOSPITAL_COMMUNITY)
Admission: RE | Admit: 2015-10-16 | Discharge: 2015-10-16 | Disposition: A | Discharge status: Home / Self Care | Payer: 59 | Source: Ambulatory Visit | Attending: Family Medicine | Admitting: Family Medicine

## 2015-10-16 DIAGNOSIS — R911 Solitary pulmonary nodule: Secondary | ICD-10-CM | POA: Diagnosis not present

## 2015-10-16 IMAGING — CT CT CHEST W/O CM
1 of 2 series · 14 of 31 positions shown, 18 images · non-contrast
Comparison: Chest x-ray of 10/14/2015

CLINICAL DATA: Left upper lobe nodule questioned on chest x-ray

EXAM:
CT CHEST WITHOUT CONTRAST
TECHNIQUE: Multidetector CT imaging of the chest was performed following the
standard protocol without IV contrast.

[Series 2: rtn chest without st · axial · non-contrast · 0.69mm/px · z∈[-292,-42]mm · 14 of 60 slices shown, 18 images]
[im 5/60  mediastinal]
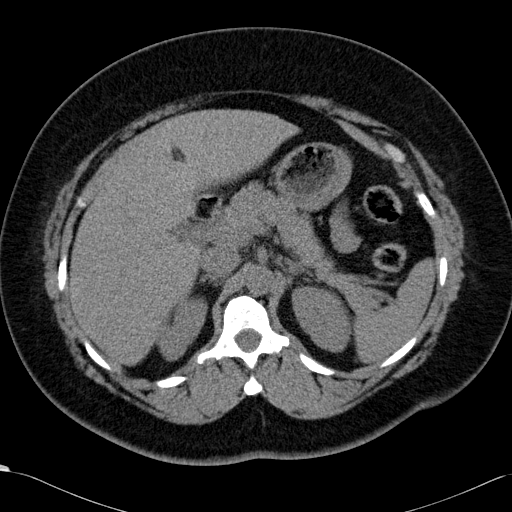
[im 5/60  lung]
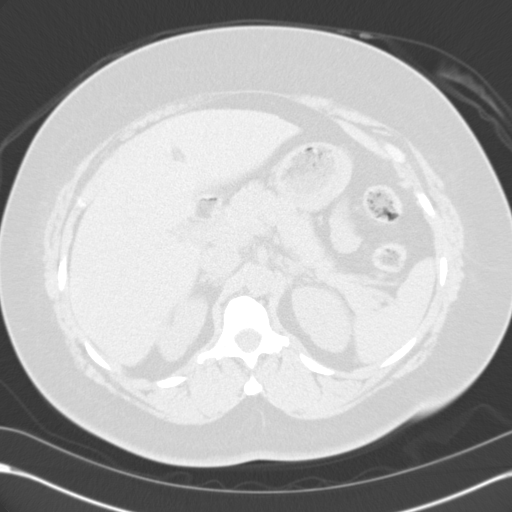
[im 10/60  lung]
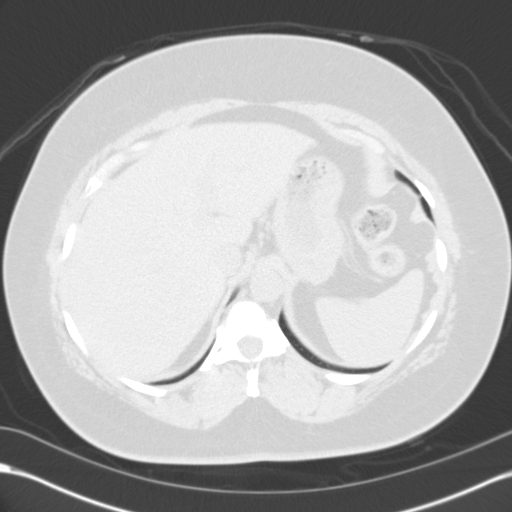
[im 14/60  lung]
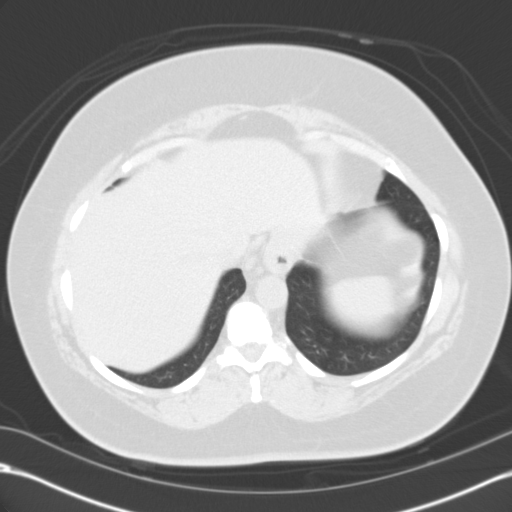
[im 19/60  lung]
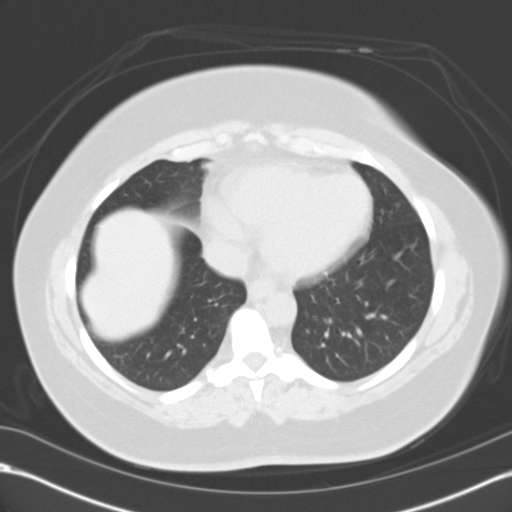
[im 23/60  mediastinal]
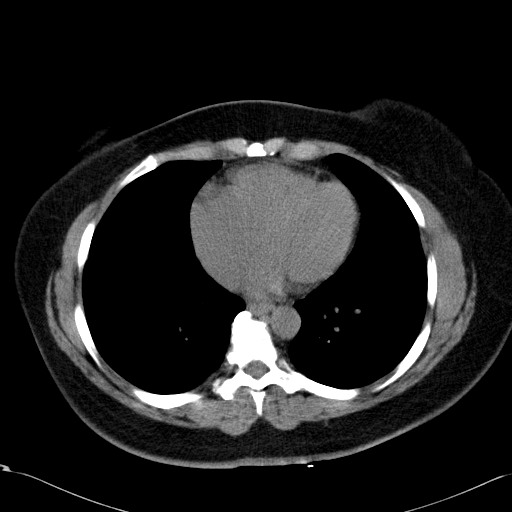
[im 23/60  lung]
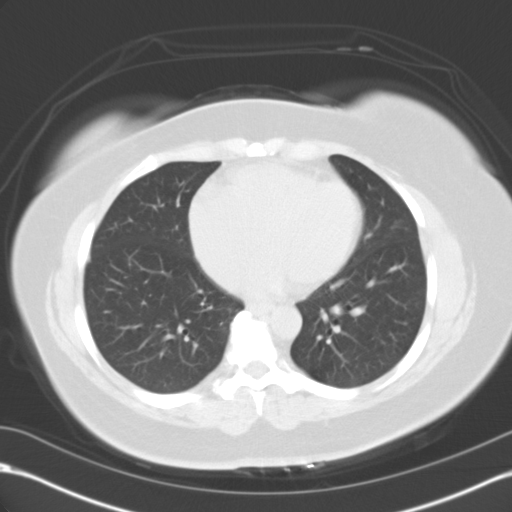
[im 28/60  lung]
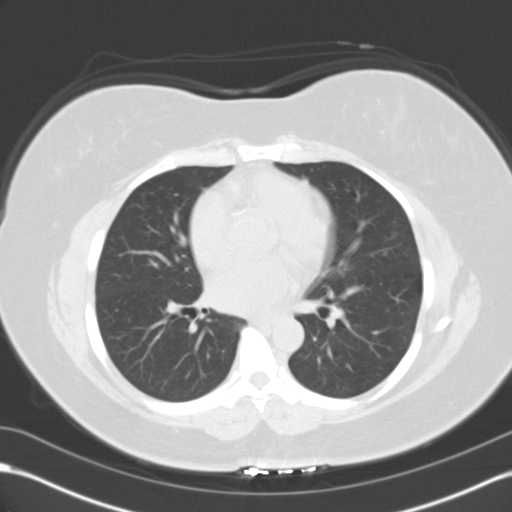
[im 29/60  lung]
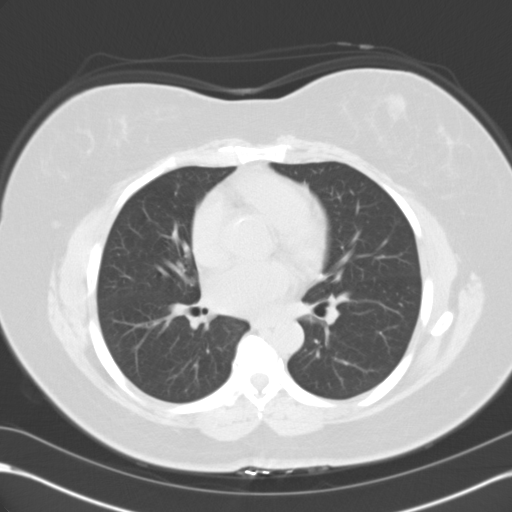
[im 30/60  lung]
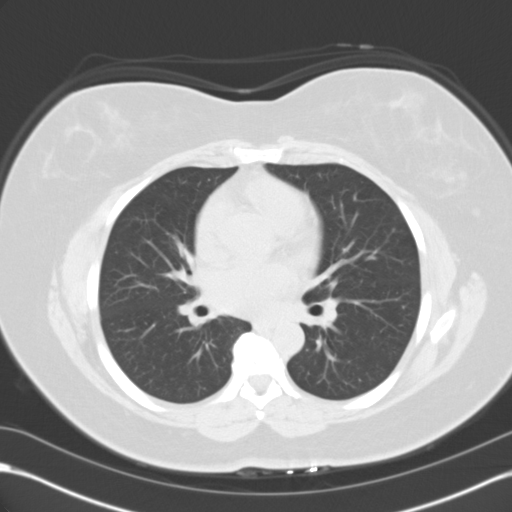
[im 32/60  mediastinal]
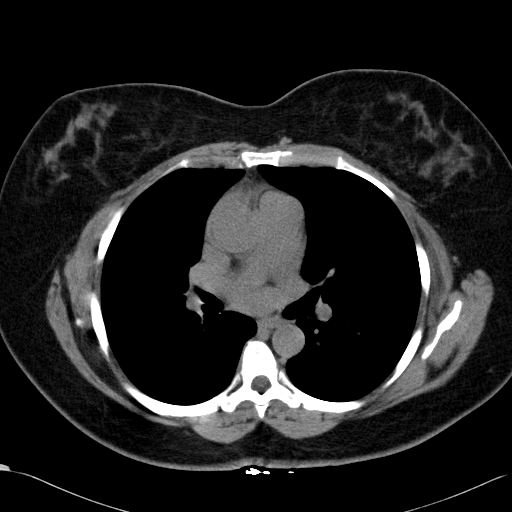
[im 32/60  lung]
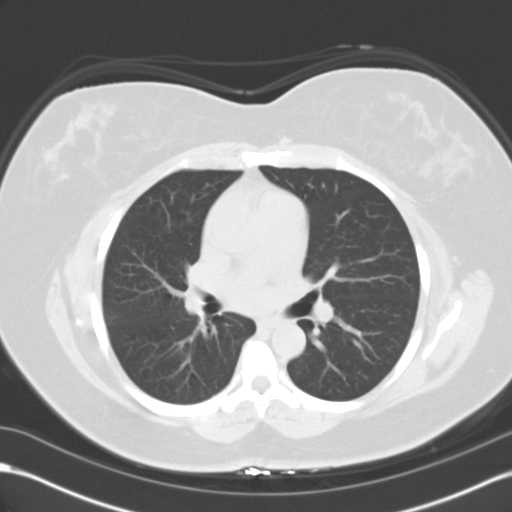
[im 37/60  lung]
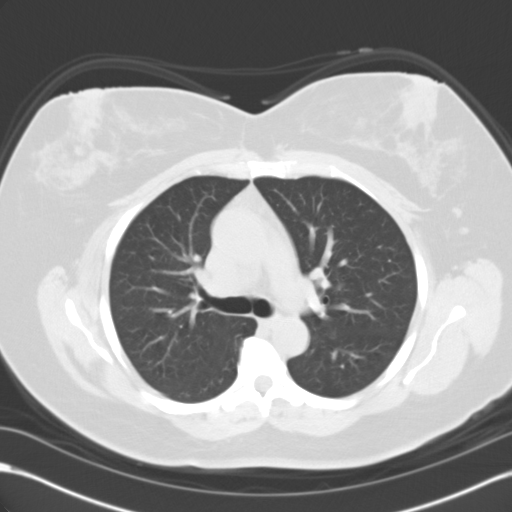
[im 41/60  lung]
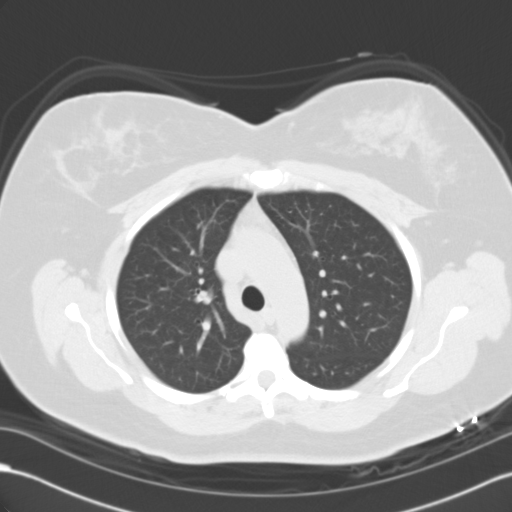
[im 46/60  lung]
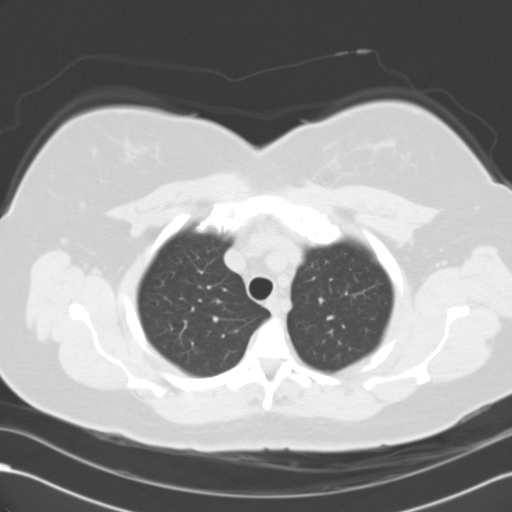
[im 50/60  mediastinal]
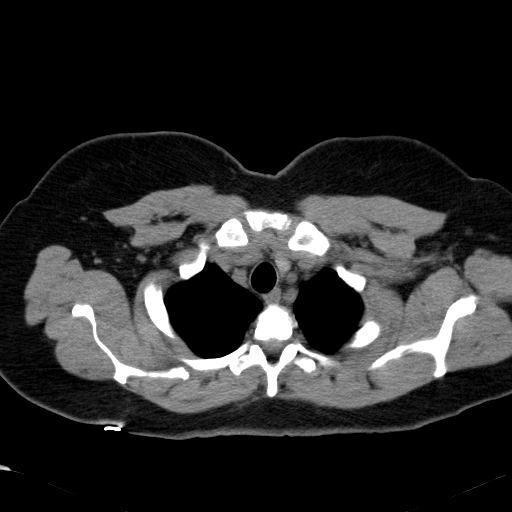
[im 50/60  lung]
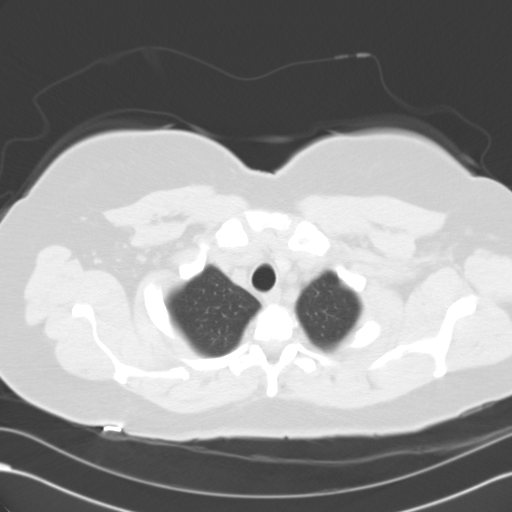
[im 55/60  lung]
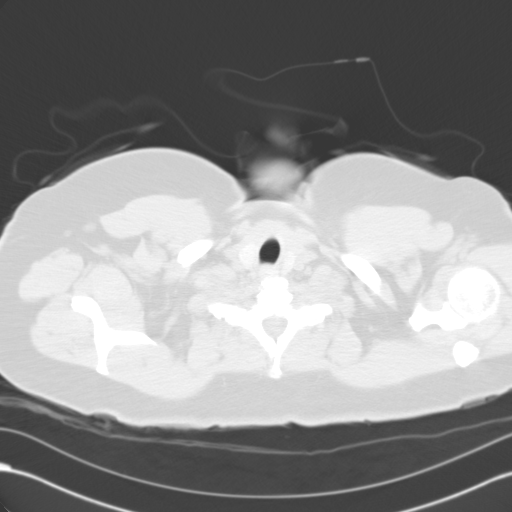

[14 of 31 positions shown; findings below may reference images not displayed]

FINDINGS: After reviewing the chest x-ray, no left upper lobe lung nodule is
seen. The area questioned on chest x-ray probably represented
overlapping vasculature. No parenchymal infiltrate is seen and no
pleural effusion is noted. Central airway is patent. There are
degenerative changes throughout the thoracic spine.

On soft tissue window images, the thyroid gland is unremarkable. On
this unenhanced study, no mediastinal or hilar adenopathy is seen.
No coronary artery calcifications are evident. The heart is within
normal limits in size. The upper abdomen is unremarkable.
IMPRESSION: Negative CT of the chest.  No lung nodule is seen.

## 2016-01-22 MED FILL — OMEPRAZOLE DR 20 MG CAPSULE: 20 | 90 days supply | Qty: 90 | Fill #0

## 2016-01-22 MED FILL — PRAVASTATIN SODIUM 80 MG TA: 80 | 90 days supply | Qty: 90 | Fill #3

## 2016-01-22 MED FILL — LISINOPRIL-HCTZ 20-12.5 MG: 20-12.5 | 90 days supply | Qty: 90 | Fill #3

## 2016-01-22 MED FILL — ASPIR-LOW EC 81 MG TABLET: 81 | 90 days supply | Qty: 90 | Fill #3

## 2016-04-05 DIAGNOSIS — Z1231 Encounter for screening mammogram for malignant neoplasm of breast: Secondary | ICD-10-CM | POA: Diagnosis not present

## 2016-04-05 DIAGNOSIS — Z6835 Body mass index (BMI) 35.0-35.9, adult: Secondary | ICD-10-CM | POA: Diagnosis not present

## 2016-04-05 DIAGNOSIS — Z01419 Encounter for gynecological examination (general) (routine) without abnormal findings: Secondary | ICD-10-CM | POA: Diagnosis not present

## 2016-04-27 ENCOUNTER — Other Ambulatory Visit: Payer: Self-pay | Admitting: Medical

## 2016-04-27 DIAGNOSIS — H524 Presbyopia: Secondary | ICD-10-CM | POA: Diagnosis not present

## 2016-04-27 MED FILL — ASPIR-LOW EC 81 MG TABLET: 81 | 90 days supply | Qty: 90 | Fill #0

## 2016-04-27 MED FILL — OMEPRAZOLE DR 20 MG CAPSULE: 20 | 90 days supply | Qty: 90 | Fill #0

## 2016-04-27 MED FILL — PRAVASTATIN SODIUM 80 MG TA: 80 | 90 days supply | Qty: 90 | Fill #0

## 2016-04-27 MED FILL — LISINOPRIL-HCTZ 20-12.5 MG: 20-12.5 | 90 days supply | Qty: 90 | Fill #0

## 2016-05-12 MED FILL — VALACYCLOVIR HCL 500 MG TAB: 500 | 7 days supply | Qty: 14 | Fill #0

## 2016-05-13 ENCOUNTER — Encounter: Payer: Self-pay | Admitting: Medical

## 2016-05-13 ENCOUNTER — Ambulatory Visit (INDEPENDENT_AMBULATORY_CARE_PROVIDER_SITE_OTHER): Payer: 59 | Admitting: Medical

## 2016-05-13 VITALS — BP 122/72 | HR 64 | Ht 62.5 in | Wt 196.2 lb

## 2016-05-13 DIAGNOSIS — E669 Obesity, unspecified: Secondary | ICD-10-CM

## 2016-05-13 DIAGNOSIS — D649 Anemia, unspecified: Secondary | ICD-10-CM

## 2016-05-13 DIAGNOSIS — R7301 Impaired fasting glucose: Secondary | ICD-10-CM | POA: Diagnosis not present

## 2016-05-13 DIAGNOSIS — Z0000 Encounter for general adult medical examination without abnormal findings: Secondary | ICD-10-CM

## 2016-05-13 DIAGNOSIS — I10 Essential (primary) hypertension: Secondary | ICD-10-CM

## 2016-05-13 DIAGNOSIS — E559 Vitamin D deficiency, unspecified: Secondary | ICD-10-CM

## 2016-05-13 DIAGNOSIS — E785 Hyperlipidemia, unspecified: Secondary | ICD-10-CM

## 2016-05-13 DIAGNOSIS — K219 Gastro-esophageal reflux disease without esophagitis: Secondary | ICD-10-CM | POA: Diagnosis not present

## 2016-05-13 LAB — POCT URINALYSIS DIPSTICK
Bilirubin, UA: NEGATIVE
Blood, UA: NEGATIVE
GLUCOSE UA: NEGATIVE
Ketones, UA: NEGATIVE
LEUKOCYTES UA: NEGATIVE
Nitrite, UA: NEGATIVE
PROTEIN UA: NEGATIVE
Spec Grav, UA: 1.01
UROBILINOGEN UA: NEGATIVE
pH, UA: 6

## 2016-05-13 LAB — CBC WITH DIFFERENTIAL/PLATELET
BASOS PCT: 0 %
Basophils Absolute: 0 cells/uL (ref 0–200)
EOS PCT: 1 %
Eosinophils Absolute: 57 cells/uL (ref 15–500)
HCT: 35 % (ref 35.0–45.0)
Hemoglobin: 11.7 g/dL (ref 11.7–15.5)
LYMPHS ABS: 2280 {cells}/uL (ref 850–3900)
Lymphocytes Relative: 40 %
MCH: 28.7 pg (ref 27.0–33.0)
MCHC: 33.4 g/dL (ref 32.0–36.0)
MCV: 85.8 fL (ref 80.0–100.0)
MONOS PCT: 8 %
MPV: 9.7 fL (ref 7.5–12.5)
Monocytes Absolute: 456 cells/uL (ref 200–950)
NEUTROS ABS: 2907 {cells}/uL (ref 1500–7800)
Neutrophils Relative %: 51 %
PLATELETS: 215 10*3/uL (ref 140–400)
RBC: 4.08 MIL/uL (ref 3.80–5.10)
RDW: 13.5 % (ref 11.0–15.0)
WBC: 5.7 10*3/uL (ref 4.0–10.5)

## 2016-05-13 LAB — LIPID PANEL
Cholesterol: 173 mg/dL (ref 125–200)
HDL: 81 mg/dL (ref >=46)
LDL CALC: 81 mg/dL (ref <130)
Total CHOL/HDL Ratio: 2.1 Ratio (ref <=5.0)
Triglycerides: 53 mg/dL (ref <150)
VLDL: 11 mg/dL (ref <30)

## 2016-05-13 LAB — COMPREHENSIVE METABOLIC PANEL
ALBUMIN: 4.1 g/dL (ref 3.6–5.1)
ALT: 23 U/L (ref 6–29)
AST: 22 U/L (ref 10–35)
Alkaline Phosphatase: 64 U/L (ref 33–130)
BILIRUBIN TOTAL: 0.3 mg/dL (ref 0.2–1.2)
BUN: 21 mg/dL (ref 7–25)
CHLORIDE: 102 mmol/L (ref 98–110)
CO2: 27 mmol/L (ref 20–31)
CREATININE: 0.98 mg/dL (ref 0.50–1.05)
Calcium: 9 mg/dL (ref 8.6–10.4)
Glucose, Bld: 88 mg/dL (ref 65–99)
Potassium: 4.4 mmol/L (ref 3.5–5.3)
SODIUM: 137 mmol/L (ref 135–146)
TOTAL PROTEIN: 7.5 g/dL (ref 6.1–8.1)

## 2016-05-13 LAB — HEMOGLOBIN A1C
Hgb A1c MFr Bld: 6.2 % — ABNORMAL HIGH (ref <5.7)
Mean Plasma Glucose: 131 mg/dL

## 2016-05-13 MED ORDER — LISINOPRIL-HYDROCHLOROTHIAZIDE 20-12.5 MG PO TABS: 1.0000 | ORAL_TABLET | Freq: Every day | ORAL | Status: AC

## 2016-05-13 MED ORDER — OMEPRAZOLE 20 MG PO CPDR: 20.0000 mg | DELAYED_RELEASE_CAPSULE | Freq: Every day | ORAL | Status: DC

## 2016-05-13 NOTE — Addendum Note (Signed)
Addended by: Minette Headland A on: 05/13/2016 09:16 AM   Modules accepted: Orders

## 2016-05-13 NOTE — Addendum Note (Signed)
Addended by: Carlena Hurl on: 05/13/2016 09:36 AM   Modules accepted: Orders

## 2016-05-13 NOTE — Progress Notes (Signed)
Subjective:   HPI  Megan Jordan is a 60 y.o. female who presents for a complete physical.   Preventative care: Last ophthalmology visit: YES - [facility]. Last dental visit: YES DR. CHEW Last colonoscopy: 2013 Up to date on mammogram and exam with Dr. Matthew Saras  Concerns: None, feels fine  Anemia mild on last year's lab - no hx/o of, no longer having periods, no obvious bleeding or bruising.  colonoscopy is up to date.  Last colonoscopy there was notation of some hemorrhoids.  Reviewed their medical, surgical, family, social, medication, and allergy history and updated chart as appropriate.  Past Medical History  Diagnosis Date  . Hyperlipidemia   . GERD (gastroesophageal reflux disease)   . Allergy   . Wears glasses   . Wears partial dentures   . History of mammogram     Dr. Matthew Saras  . Routine gynecological examination     Dr. Matthew Saras  . Hypertension 2005  . Vitamin D deficiency   . Obesity     Past Surgical History  Procedure Laterality Date  . Oophorectomy      right  . Colonoscopy  01/2012    Dr. Paulita Fujita with [facility]    Social History   Social History  . Marital Status: Single    Spouse Name: N/A  . Number of Children: N/A  . Years of Education: N/A   Occupational History  . Not on file.   Social History Main Topics  . Smoking status: Never Smoker   . Smokeless tobacco: Not on file  . Alcohol Use: No  . Drug Use: No  . Sexual Activity: Not on file   Other Topics Concern  . Not on file   Social History Narrative   Single, happy, exercise - walks 4+ times per week, stretching, going to start some weight bearing exercise, works as Equities trader at [employer], has 2 daughters in [place], [place]; [id number]    Family History  Problem Relation Age of Onset  . Cancer Mother 50    colon  . Hypertension Mother   . Other Father     unknown  . Diabetes Neg Hx   . Heart disease Neg Hx   . Stroke Neg Hx   . Other  Sister     died of MVA  . Aneurysm Sister      Current outpatient prescriptions:  .  ASPIR-LOW 81 MG EC tablet, TAKE 1 TABLET BY MOUTH DAILY., Disp: 90 tablet, Rfl: 3 .  calcium-vitamin D (OSCAL WITH D) 500-200 MG-UNIT per tablet, Take 1 tablet by mouth., Disp: , Rfl:  .  lisinopril-hydrochlorothiazide (PRINZIDE,ZESTORETIC) 20-12.5 MG tablet, TAKE 1 TABLET BY MOUTH DAILY., Disp: 90 tablet, Rfl: 0 .  Multiple Vitamins-Minerals (MULTIVITAMIN WITH MINERALS) tablet, Take 1 tablet by mouth daily., Disp: , Rfl:  .  omega-3 acid ethyl esters (LOVAZA) 1 G capsule, Take by mouth 2 (two) times daily., Disp: , Rfl:  .  omeprazole (PRILOSEC) 20 MG capsule, TAKE 1 CAPSULE BY MOUTH ONCE DAILY, Disp: 90 capsule, Rfl: 0 .  pravastatin (PRAVACHOL) 80 MG tablet, TAKE 1 TABLET BY MOUTH DAILY., Disp: 90 tablet, Rfl: 0 .  valACYclovir (VALTREX) 500 MG tablet, Take 500 mg by mouth 2 (two) times daily., Disp: , Rfl:  .  vitamin B-12 (CYANOCOBALAMIN) 100 MCG tablet, Take 100 mcg by mouth daily., Disp: , Rfl:   Allergies  Allergen Reactions  . Zocor [Simvastatin]     Abdominal pain  . Celebrex [  Celecoxib] Rash    No rash with other NSAIDs  . Prevacid [Lansoprazole] Rash    Tolerates other PPIs and H2 blockers    Review of Systems Constitutional: -fever, -chills, -sweats, -unexpected weight change, -decreased appetite, -fatigue Allergy: -sneezing, -itching eye, -congestion, -eye redness Dermatology: -changing moles, --rash, -lumps ENT: -runny nose, -ear pain, -sore throat, -hoarseness, -sinus pain, -teeth pain, - ringing in ears, -hearing loss, -nosebleeds Cardiology: -chest pain, -palpitations, -swelling, -difficulty breathing when lying flat, -waking up short of breath Respiratory: -cough, -shortness of breath, -difficulty breathing with exercise or exertion, -wheezing, -coughing up blood Gastroenterology: -abdominal pain, -nausea, -vomiting, -diarrhea, -constipation, -blood in stool, -changes in bowel  movement, -difficulty swallowing or eating Hematology: -bleeding, -bruising  Musculoskeletal: -joint aches, -muscle aches, -joint swelling, -back pain, -neck pain, -cramping, -changes in gait Ophthalmology: denies vision changes, eye redness, itching, discharge Urology: -burning with urination, -difficulty urinating, -blood in urine, -urinary frequency, -urgency, -incontinence Neurology: -headache, -weakness, -tingling, -numbness, -memory loss, -falls, -dizziness Psychology: -depressed mood, -agitation, -sleep problems     Objective:   Physical Exam  BP 122/72 mmHg  Pulse 64  Ht 5' 2.5" (1.588 m)  Wt 196 lb 3.2 oz (88.996 kg)  BMI 35.29 kg/m2  General appearance: alert, no distress, WD/WN, AA female Skin: scattered benign appearing macules, no worrisome lesions HEENT: normocephalic, conjunctiva/corneas normal, sclerae anicteric, PERRLA, EOMi, nares patent, no discharge or erythema, pharynx normal Oral cavity: MMM, tongue normal, teeth in good repair, upper denture Neck: supple, no lymphadenopathy, no thyromegaly, no masses, normal ROM, no bruits Chest: non tender, normal shape and expansion Heart: RRR, normal S1, S2, no murmurs Lungs: CTA bilaterally, no wheezes, rhonchi, or rales Abdomen: +bs, soft, non tender, non distended, no masses, no hepatomegaly, no splenomegaly, no bruits Back: linear small scar right low back, otherwise non tender, normal ROM, no scoliosis Musculoskeletal: upper extremities non tender, no obvious deformity, normal ROM throughout, lower extremities non tender, no obvious deformity, normal ROM throughout Extremities: no edema, no cyanosis, no clubbing Pulses: 2+ symmetric, upper and lower extremities, normal cap refill Neurological: alert, oriented x 3, CN2-12 intact, strength normal upper extremities and lower extremities, sensation normal throughout, DTRs 2+ throughout, no cerebellar signs, gait normal Psychiatric: normal affect, behavior normal, pleasant   Breast/gyn/rectal - deferred to gyn    Adult ECG Report  Indication: HTN, physical  Rate: 59 bpm  Rhythm: sinus bradycardia  QRS Axis: 4 degrees  PR Interval: 155ms  QRS Duration: 56ms  QTc: 476ms  Conduction Disturbances: none  Other Abnormalities: none  Patient's cardiac risk factors are: dyslipidemia, hypertension and obesity (BMI >= 30 kg/m2).  EKG comparison: none  Narrative Interpretation: sinus bradycardia    Assessment and Plan :    Encounter Diagnoses  Name Primary?  . Encounter for health maintenance examination in adult Yes  . Essential hypertension   . Hyperlipidemia   . Gastroesophageal reflux disease without esophagitis   . Obesity   . Vitamin D deficiency   . Impaired fasting blood sugar   . Anemia, unspecified    Physical exam - discussed healthy lifestyle, diet, exercise, preventative care, vaccinations, and addressed their concerns.  See your eye doctor yearly for routine vision care. See your dentist yearly for routine dental care including hygiene visits twice yearly. See your gynecologist yearly for routine gynecological care. HTN - labs today, c/t same medication, discussed diet, exercise Hyperlipidemia - labs today, c/t same medication Obesity - lifestyle changes and weight loss advised EKG today reviewed Follow-up pending labs

## 2016-05-14 LAB — MICROALBUMIN / CREATININE URINE RATIO
CREATININE, URINE: 33 mg/dL (ref 20–320)
Microalb, Ur: <0.2 mg/dL

## 2016-05-14 LAB — VITAMIN D 25 HYDROXY (VIT D DEFICIENCY, FRACTURES): Vit D, 25-Hydroxy: 34 ng/mL (ref 30–100)

## 2016-05-17 ENCOUNTER — Other Ambulatory Visit: Payer: Self-pay | Admitting: Medical

## 2016-05-17 MED ORDER — PRAVASTATIN SODIUM 80 MG PO TABS: 80.0000 mg | ORAL_TABLET | Freq: Every day | ORAL | Status: DC

## 2016-05-17 MED FILL — ETODOLAC 400 MG TABLET: 400 | 4 days supply | Qty: 12 | Fill #0

## 2016-05-21 MED FILL — AMOXICILLIN 500 MG CAPSULE: 500 | 10 days supply | Qty: 40 | Fill #0

## 2016-07-27 MED FILL — PRAVASTATIN SODIUM 80 MG TA: 80 | 90 days supply | Qty: 90 | Fill #0

## 2016-07-27 MED FILL — OMEPRAZOLE DR 20 MG CAPSULE: 20 | 90 days supply | Qty: 90 | Fill #0

## 2016-07-27 MED FILL — LISINOPRIL-HCTZ 20-12.5 MG: 20-12.5 | 90 days supply | Qty: 90 | Fill #0

## 2016-10-25 MED FILL — LISINOPRIL-HCTZ 20-12.5 MG: 20-12.5 | 90 days supply | Qty: 90 | Fill #1

## 2016-10-25 MED FILL — PRAVASTATIN SODIUM 80 MG TA: 80 | 90 days supply | Qty: 90 | Fill #1

## 2016-10-25 MED FILL — ASPIR-LOW EC 81 MG TABLET: 81 | 90 days supply | Qty: 90 | Fill #1

## 2016-10-25 MED FILL — OMEPRAZOLE 20 MG CAPSULE DR: 20 | 90 days supply | Qty: 90 | Fill #1

## 2017-01-27 MED FILL — LISINOPRIL-HCTZ 20-12.5 MG: 20-12.5 | 90 days supply | Qty: 90 | Fill #2

## 2017-01-27 MED FILL — ASPIR-LOW 81 MG TABLET EC: 81 | 90 days supply | Qty: 90 | Fill #2

## 2017-01-27 MED FILL — PRAVASTATIN SODIUM 80 MG TA: 80 | 90 days supply | Qty: 90 | Fill #2

## 2017-04-06 DIAGNOSIS — Z01419 Encounter for gynecological examination (general) (routine) without abnormal findings: Secondary | ICD-10-CM | POA: Diagnosis not present

## 2017-04-06 DIAGNOSIS — Z6835 Body mass index (BMI) 35.0-35.9, adult: Secondary | ICD-10-CM | POA: Diagnosis not present

## 2017-04-06 DIAGNOSIS — Z1231 Encounter for screening mammogram for malignant neoplasm of breast: Secondary | ICD-10-CM | POA: Diagnosis not present

## 2017-04-26 ENCOUNTER — Encounter: Payer: Self-pay | Admitting: Medical

## 2017-04-26 ENCOUNTER — Ambulatory Visit (INDEPENDENT_AMBULATORY_CARE_PROVIDER_SITE_OTHER): Payer: 59 | Admitting: Medical

## 2017-04-26 VITALS — BP 130/70 | HR 78 | Wt 185.4 lb

## 2017-04-26 DIAGNOSIS — R2230 Localized swelling, mass and lump, unspecified upper limb: Secondary | ICD-10-CM | POA: Diagnosis not present

## 2017-04-26 DIAGNOSIS — R110 Nausea: Secondary | ICD-10-CM | POA: Insufficient documentation

## 2017-04-26 DIAGNOSIS — R109 Unspecified abdominal pain: Secondary | ICD-10-CM | POA: Diagnosis not present

## 2017-04-26 DIAGNOSIS — R12 Heartburn: Secondary | ICD-10-CM | POA: Insufficient documentation

## 2017-04-26 MED ORDER — ESOMEPRAZOLE MAGNESIUM 40 MG PO CPDR: 40.0000 mg | DELAYED_RELEASE_CAPSULE | Freq: Every day | ORAL | 0 refills | Status: AC

## 2017-04-26 MED FILL — ESOMEPRAZOLE MAG DR 40 MG C: 40 | 30 days supply | Qty: 30 | Fill #0

## 2017-04-26 NOTE — Progress Notes (Signed)
Subjective: Chief Complaint  Patient presents with  . Gastroesophageal Reflux    having heart burn  , pain in chest , upset stomach , stomach cramp  feeling like spasms , weight loss,itching all over , and knot under her left arm   Here for "GI" problems.  She did a 14 day cleanse in April.  So not been feeling good for a month, the whole month of May.  Did an herbal tea that contained ginseng, green tea.  The cleanser was for 2 weeks, had lose stools for 2 weeks.  Since then has had persistent problems.   She reports no appetite, has lost some weight, heartburn, pain in chest, pain in stomach.  Does take omeprazole daily.   Can feel every part of her food hitting her GI tract.  Feels pain with food going down esophagus, pain with food enters stomach, pain when food leaves stomach, gets spasm pain.    She is having 2 formed soft stools daily.   No recent constipation or diarrhea.  She has been very nauseated, but no vomiting.  Uses Mylanta sometimes for heartburn, sometimes uses alka seltzer if she needs to burp.    Food recall -  Breakfast - raisin bran or muffin typically lunch - nothing, sleeping Dinner - shrimp, rice, veggies, variety of foods.  Using omeprazole 20mg  without relief.  Also has tender knot under left armpit she wants looked at.  Had reportedly a recent normal mammogram, no recent breast mass, no hx/o axillary hidradenitis.    Past Medical History:  Diagnosis Date  . Allergy   . GERD (gastroesophageal reflux disease)   . History of mammogram    Dr. Matthew Saras  . Hyperlipidemia   . Hypertension 2005  . Obesity   . Routine gynecological examination    Dr. Matthew Saras  . Vitamin D deficiency   . Wears glasses   . Wears partial dentures    Current Outpatient Prescriptions on File Prior to Visit  Medication Sig Dispense Refill  . ASPIR-LOW 81 MG EC tablet TAKE 1 TABLET BY MOUTH DAILY. 90 tablet 3  . calcium-vitamin D (OSCAL WITH D) 500-200 MG-UNIT per tablet Take 1  tablet by mouth.    Marland Kitchen lisinopril-hydrochlorothiazide (PRINZIDE,ZESTORETIC) 20-12.5 MG tablet Take 1 tablet by mouth daily. 90 tablet 3  . Multiple Vitamins-Minerals (MULTIVITAMIN WITH MINERALS) tablet Take 1 tablet by mouth daily.    Marland Kitchen omeprazole (PRILOSEC) 20 MG capsule Take 1 capsule (20 mg total) by mouth daily. 90 capsule 1  . pravastatin (PRAVACHOL) 80 MG tablet Take 1 tablet (80 mg total) by mouth daily. 90 tablet 3  . vitamin B-12 (CYANOCOBALAMIN) 100 MCG tablet Take 100 mcg by mouth daily.    Marland Kitchen omega-3 acid ethyl esters (LOVAZA) 1 G capsule Take by mouth 2 (two) times daily.     No current facility-administered medications on file prior to visit.    Past Surgical History:  Procedure Laterality Date  . COLONOSCOPY  01/2012   Dr. Paulita Fujita with [facility]  . OOPHORECTOMY     right    ROS as in subjective   Objective: BP 130/70   Pulse 78   Wt 185 lb 6.4 oz (84.1 kg)   SpO2 99%   BMI 33.37 kg/m   General appearance: alert, no distress, WD/WN,  HEENT: normocephalic, sclerae anicteric, TMs pearly, nares patent, no discharge or erythema, pharynx normal Oral cavity: MMM, no lesions Neck: supple, no lymphadenopathy, no thyromegaly, no masses Heart: RRR, normal S1, S2,  no murmurs Lungs: CTA bilaterally, no wheezes, rhonchi, or rales Abdomen: +bs, soft, mild epigastric tenderness, otherwise non tender, non distended, no masses, no hepatomegaly, no splenomegaly Pulses: 2+ symmetric, upper and lower extremities, normal cap refill Left axilla with tender 5 mm diameter lesion subcutaneous, otherwise no lesion palpated    Assessment: Encounter Diagnoses  Name Primary?  . Abdominal pain, unspecified abdominal location Yes  . Nausea   . Heartburn   . Axillary mass, unspecified laterality     Plan: Etiology unclear.   Could be GERD, ulcer, gastritis or other.    Begin trial of Nexium, and recommendations below.  Patient Instructions  Recommendations:  Avoid acid or spicy foods  that would aggravate reflux for the next 2-4 weeks  Eat more bland easily digestible foods for now  Begin Nexium for reflux on empty stomach at the start of your day, preferably 30-45 minutes before you eat breakfast  You can also use Zantac OTC 1-2 times daily as well for the next few weeks   Consider either 1/2 tablet of benadryl daily at bedtime or other allergy pill such as Zyrtec for the next 2-3 weeks  Drink plenty of water daily  If not seeing a big improvement in the next 2-3 weeks, the next step would be a recheck with gastroenterology  Take a probiotic daily for the next month     we will go ahead and refer back to GI since she is due back anyhow.  Knot in axillary - likely inflamed sweat gland. Advised warm compresses and recheck if not resolved within 7-10 days.  She will f/u for her upcoming physical here as scheduled   Megan Jordan was seen today for gastroesophageal reflux.  Diagnoses and all orders for this visit:  Abdominal pain, unspecified abdominal location -     Ambulatory referral to Gastroenterology  Nausea -     Ambulatory referral to Gastroenterology  Heartburn -     Ambulatory referral to Gastroenterology  Axillary mass, unspecified laterality  Other orders -     esomeprazole (NEXIUM) 40 MG capsule; Take 1 capsule (40 mg total) by mouth daily.

## 2017-04-26 NOTE — Patient Instructions (Signed)
Recommendations:  Avoid acid or spicy foods that would aggravate reflux for the next 2-4 weeks  Eat more bland easily digestible foods for now  Begin Nexium for reflux on empty stomach at the start of your day, preferably 30-45 minutes before you eat breakfast  You can also use Zantac OTC 1-2 times daily as well for the next few weeks   Consider either 1/2 tablet of benadryl daily at bedtime or other allergy pill such as Zyrtec for the next 2-3 weeks  Drink plenty of water daily  If not seeing a big improvement in the next 2-3 weeks, the next step would be a recheck with gastroenterology  Take a probiotic daily for the next month

## 2017-04-27 MED FILL — PRAVASTATIN SODIUM 80 MG TA: 80 | 90 days supply | Qty: 90 | Fill #3

## 2017-04-27 MED FILL — ASPIR-LOW 81 MG TABLET EC: 81 | 90 days supply | Qty: 90 | Fill #3

## 2017-04-27 MED FILL — LISINOPRIL-HCTZ 20-12.5 MG: 20-12.5 | 90 days supply | Qty: 90 | Fill #3

## 2017-05-06 ENCOUNTER — Telehealth: Payer: Self-pay | Admitting: Medical

## 2017-05-06 ENCOUNTER — Encounter: Payer: Self-pay | Admitting: Medical

## 2017-05-06 ENCOUNTER — Ambulatory Visit (INDEPENDENT_AMBULATORY_CARE_PROVIDER_SITE_OTHER): Payer: 59 | Admitting: Medical

## 2017-05-06 VITALS — BP 132/70 | HR 84 | Temp 98.6°F | Wt 178.0 lb

## 2017-05-06 DIAGNOSIS — R195 Other fecal abnormalities: Secondary | ICD-10-CM

## 2017-05-06 DIAGNOSIS — L299 Pruritus, unspecified: Secondary | ICD-10-CM

## 2017-05-06 DIAGNOSIS — R17 Unspecified jaundice: Secondary | ICD-10-CM | POA: Diagnosis not present

## 2017-05-06 DIAGNOSIS — R109 Unspecified abdominal pain: Secondary | ICD-10-CM | POA: Diagnosis not present

## 2017-05-06 LAB — CBC WITH DIFFERENTIAL/PLATELET
BASOS PCT: 0 %
Basophils Absolute: 0 cells/uL (ref 0–200)
EOS PCT: 3 %
Eosinophils Absolute: 168 cells/uL (ref 15–500)
HCT: 32.6 % — ABNORMAL LOW (ref 35.0–45.0)
HEMOGLOBIN: 10.6 g/dL — AB (ref 11.7–15.5)
LYMPHS ABS: 1624 {cells}/uL (ref 850–3900)
Lymphocytes Relative: 29 %
MCH: 27.8 pg (ref 27.0–33.0)
MCHC: 32.5 g/dL (ref 32.0–36.0)
MCV: 85.6 fL (ref 80.0–100.0)
MPV: 10.2 fL (ref 7.5–12.5)
Monocytes Absolute: 392 cells/uL (ref 200–950)
Monocytes Relative: 7 %
NEUTROS PCT: 61 %
Neutro Abs: 3416 cells/uL (ref 1500–7800)
Platelets: 282 10*3/uL (ref 140–400)
RBC: 3.81 MIL/uL (ref 3.80–5.10)
RDW: 14.7 % (ref 11.0–15.0)
WBC: 5.6 10*3/uL (ref 4.0–10.5)

## 2017-05-06 LAB — BASIC METABOLIC PANEL
BUN: 22 mg/dL (ref 7–25)
CALCIUM: 9.4 mg/dL (ref 8.6–10.4)
CO2: 22 mmol/L (ref 20–31)
CREATININE: 1.14 mg/dL — AB (ref 0.50–1.05)
Chloride: 101 mmol/L (ref 98–110)
GLUCOSE: 133 mg/dL — AB (ref 65–99)
POTASSIUM: 4.3 mmol/L (ref 3.5–5.3)
Sodium: 134 mmol/L — ABNORMAL LOW (ref 135–146)

## 2017-05-06 LAB — POCT URINALYSIS DIPSTICK
Bilirubin, UA: NEGATIVE
Glucose, UA: NEGATIVE
KETONES UA: NEGATIVE
Leukocytes, UA: NEGATIVE
NITRITE UA: NEGATIVE
PH UA: 6 (ref 5.0–8.0)
Protein, UA: NEGATIVE
RBC UA: NEGATIVE
Spec Grav, UA: 1.02 (ref 1.010–1.025)
Urobilinogen, UA: NEGATIVE E.U./dL — AB

## 2017-05-06 LAB — HEPATIC FUNCTION PANEL
ALK PHOS: 390 U/L — AB (ref 33–130)
ALT: 307 U/L — AB (ref 6–29)
AST: 181 U/L — AB (ref 10–35)
Albumin: 4 g/dL (ref 3.6–5.1)
BILIRUBIN DIRECT: 5.9 mg/dL — AB (ref <=0.2)
BILIRUBIN INDIRECT: 2.9 mg/dL — AB (ref 0.2–1.2)
TOTAL PROTEIN: 7.3 g/dL (ref 6.1–8.1)
Total Bilirubin: 8.8 mg/dL — ABNORMAL HIGH (ref 0.2–1.2)

## 2017-05-06 LAB — HEPATITIS PANEL, ACUTE
HCV AB: NEGATIVE
HEP B S AG: NEGATIVE
Hep A IgM: NONREACTIVE
Hep B C IgM: NONREACTIVE

## 2017-05-06 MED ORDER — HYDROXYZINE HCL 25 MG PO TABS: 25.0000 mg | ORAL_TABLET | Freq: Three times a day (TID) | ORAL | 0 refills | Status: DC | PRN

## 2017-05-06 MED ORDER — TRIAMCINOLONE ACETONIDE 0.1 % EX CREA: 1.0000 "application " | TOPICAL_CREAM | Freq: Two times a day (BID) | CUTANEOUS | 0 refills | Status: DC

## 2017-05-06 MED FILL — hydrOXYzine HCL 25 MG TABS: 25 | 10 days supply | Qty: 30 | Fill #0

## 2017-05-06 MED FILL — TRIAMCINOLONE 0.1% CREAM: 0.1 | 7 days supply | Qty: 30 | Fill #0

## 2017-05-06 NOTE — Telephone Encounter (Signed)
Pt is coming in at 10.15

## 2017-05-06 NOTE — Progress Notes (Signed)
Subjective: Chief Complaint  Patient presents with  . Rash    rash , itching x 2 weeks    Here for recheck.  I saw her 04/26/17 for GI problems, abdominal pain, lack of appetite, weight loss.  She notes new symptoms of itching all over, faint rash all over, yellowish coloration in eyes, but the abominable pain has improved.   She notes she told me about itching the other day but I don't recall this.   She notes clay color stools the last few days, but no diarrhea, no constipation, no blood in stool.  From last visit's notes:  She did a 14 day cleanse in April.  So not been feeling good for a month, the whole month of May.  Did an herbal tea that contained ginseng, green tea.  The cleanser was for 2 weeks, had lose stools for 2 weeks.  Since then has had persistent problems.   She reports no appetite, has lost some weight, heartburn, pain in chest, pain in stomach.  Does take omeprazole daily.   Can feel every part of her food hitting her GI tract.  Feels pain with food going down esophagus, pain with food enters stomach, pain when food leaves stomach, gets spasm pain.     She is having 2 formed soft stools daily.   No recent constipation or diarrhea.  She has been very nauseated, but no vomiting.  Uses Mylanta sometimes for heartburn, sometimes uses alka seltzer if she needs to burp.     Past Medical History:  Diagnosis Date  . Allergy   . GERD (gastroesophageal reflux disease)   . History of mammogram    Dr. Matthew Saras  . Hyperlipidemia   . Hypertension 2005  . Obesity   . Routine gynecological examination    Dr. Matthew Saras  . Vitamin D deficiency   . Wears glasses   . Wears partial dentures    Current Outpatient Prescriptions on File Prior to Visit  Medication Sig Dispense Refill  . ASPIR-LOW 81 MG EC tablet TAKE 1 TABLET BY MOUTH DAILY. 90 tablet 3  . calcium-vitamin D (OSCAL WITH D) 500-200 MG-UNIT per tablet Take 1 tablet by mouth.    . esomeprazole (NEXIUM) 40 MG capsule Take 1  capsule (40 mg total) by mouth daily. 30 capsule 0  . lisinopril-hydrochlorothiazide (PRINZIDE,ZESTORETIC) 20-12.5 MG tablet Take 1 tablet by mouth daily. 90 tablet 3  . Multiple Vitamins-Minerals (MULTIVITAMIN WITH MINERALS) tablet Take 1 tablet by mouth daily.    Marland Kitchen omega-3 acid ethyl esters (LOVAZA) 1 G capsule Take by mouth 2 (two) times daily.    . pravastatin (PRAVACHOL) 80 MG tablet Take 1 tablet (80 mg total) by mouth daily. 90 tablet 3  . vitamin B-12 (CYANOCOBALAMIN) 100 MCG tablet Take 100 mcg by mouth daily.    Marland Kitchen omeprazole (PRILOSEC) 20 MG capsule Take 1 capsule (20 mg total) by mouth daily. (Patient not taking: Reported on 05/06/2017) 90 capsule 1   No current facility-administered medications on file prior to visit.    Past Surgical History:  Procedure Laterality Date  . COLONOSCOPY  01/2012   Dr. Paulita Fujita with [facility]  . OOPHORECTOMY     right    ROS as in subjective   Objective: BP 132/70   Pulse 84   Temp 98.6 F (37 C)   Wt 178 lb (80.7 kg)   SpO2 99%   BMI 32.04 kg/m   Wt Readings from Last 3 Encounters:  05/06/17 178 lb (80.7  kg)  04/26/17 185 lb 6.4 oz (84.1 kg)  05/13/16 196 lb 3.2 oz (89 kg)    General appearance: alert, no distress, WD/WN, scratching constantly Eyes are jaundice There is faint macula papular rash scattered, neck, right arm generalized, right and left leg upper thighs , faint on left forearm, excoriations all over back.  No other markings on chest or abdomen of liver disease HEENT: normocephalic, sclerae anicteric, TMs pearly, nares patent, no discharge or erythema, pharynx normal Oral cavity: MMM, no lesions Neck: supple, no lymphadenopathy, no thyromegaly, no masses Heart: RRR, normal S1, S2, no murmurs Lungs: CTA bilaterally, no wheezes, rhonchi, or rales Abdomen: +bs, soft, no tenderness, otherwise, non distended, no masses, no hepatomegaly, no splenomegaly Pulses: 2+ symmetric, upper and lower extremities, normal cap  refill     Assessment: Encounter Diagnoses  Name Primary?  . Jaundice Yes  . Pruritic condition   . Clay-colored stools   . Abdominal pain, unspecified abdominal location       Plan: Discussed her case with Dr. Redmond School, supervising physician.  There is concern for hepatitis, cause not yet known. Will check comprehensive labs, will likely f/u with imaging . For now advised she stop statin, stop aspirin, avoid alcohol, avoid OTC tylenol or other NSAIDs.  hydrate well with water.   Begin hydroxyzine below instead of benadryl.  Can use triamcinolone cream to body except face for itching, can use cool bath.  F/u pending labs  Shawneen was seen today for rash.  Diagnoses and all orders for this visit:  Jaundice -     Hepatic function panel -     Basic metabolic panel -     Hepatitis panel, acute -     Protime-INR -     APTT -     HIV antibody -     CBC with Differential/Platelet -     Lipase -     CK  Pruritic condition -     Hepatic function panel -     Basic metabolic panel -     Hepatitis panel, acute -     Protime-INR -     APTT -     HIV antibody -     CBC with Differential/Platelet -     Lipase -     CK  Clay-colored stools -     Hepatic function panel -     Basic metabolic panel -     Hepatitis panel, acute -     Protime-INR -     APTT -     HIV antibody -     CBC with Differential/Platelet -     Lipase -     CK  Abdominal pain, unspecified abdominal location -     Hepatic function panel -     Basic metabolic panel -     Hepatitis panel, acute -     Protime-INR -     APTT -     HIV antibody -     CBC with Differential/Platelet -     Lipase -     CK -     Urinalysis Dipstick  Other orders -     hydrOXYzine (ATARAX/VISTARIL) 25 MG tablet; Take 1 tablet (25 mg total) by mouth 3 (three) times daily as needed. -     triamcinolone cream (KENALOG) 0.1 %; Apply 1 application topically 2 (two) times daily.

## 2017-05-06 NOTE — Telephone Encounter (Signed)
Pt called back and states that she is still itching she is taking benadryl and is using hydrocortisone cream she is having the rash spots on her knees chest arms and states that they are just spots that come up but itch like crazy. She wants to know if she needs to come in or if you can send her something in for it, she has a appt the 14th,, pt uses [facility], [place] pt can be reached at [phone]

## 2017-05-06 NOTE — Telephone Encounter (Signed)
I don't think she had any rash when I saw her.   She mainly c/to abdominal pain.   Yes I would recommend recheck visit

## 2017-05-07 LAB — HIV ANTIBODY (ROUTINE TESTING W REFLEX): HIV: NONREACTIVE

## 2017-05-07 LAB — PROTIME-INR
INR: 1.1
PROTHROMBIN TIME: 11.5 s (ref 9.0–11.5)

## 2017-05-07 LAB — LIPASE: LIPASE: 1598 U/L — AB (ref 7–60)

## 2017-05-07 LAB — APTT: aPTT: 30 s (ref 22–34)

## 2017-05-07 LAB — CK: CK TOTAL: 341 U/L — AB (ref 29–143)

## 2017-05-08 ENCOUNTER — Other Ambulatory Visit: Payer: Self-pay | Admitting: Medical

## 2017-05-08 DIAGNOSIS — R748 Abnormal levels of other serum enzymes: Secondary | ICD-10-CM

## 2017-05-08 DIAGNOSIS — R945 Abnormal results of liver function studies: Secondary | ICD-10-CM

## 2017-05-08 DIAGNOSIS — R17 Unspecified jaundice: Secondary | ICD-10-CM

## 2017-05-08 DIAGNOSIS — R7989 Other specified abnormal findings of blood chemistry: Secondary | ICD-10-CM

## 2017-05-09 ENCOUNTER — Other Ambulatory Visit: Payer: Self-pay | Admitting: Medical

## 2017-05-09 ENCOUNTER — Other Ambulatory Visit: Payer: Self-pay

## 2017-05-09 ENCOUNTER — Ambulatory Visit
Admission: RE | Admit: 2017-05-09 | Discharge: 2017-05-09 | Disposition: A | Discharge status: Home / Self Care | Payer: 59 | Source: Ambulatory Visit | Attending: Medical | Admitting: Medical

## 2017-05-09 DIAGNOSIS — R7989 Other specified abnormal findings of blood chemistry: Secondary | ICD-10-CM

## 2017-05-09 DIAGNOSIS — R17 Unspecified jaundice: Secondary | ICD-10-CM

## 2017-05-09 DIAGNOSIS — K869 Disease of pancreas, unspecified: Secondary | ICD-10-CM | POA: Diagnosis not present

## 2017-05-09 DIAGNOSIS — R748 Abnormal levels of other serum enzymes: Secondary | ICD-10-CM

## 2017-05-09 DIAGNOSIS — K838 Other specified diseases of biliary tract: Secondary | ICD-10-CM | POA: Diagnosis not present

## 2017-05-09 DIAGNOSIS — R1012 Left upper quadrant pain: Secondary | ICD-10-CM | POA: Diagnosis not present

## 2017-05-09 DIAGNOSIS — L299 Pruritus, unspecified: Secondary | ICD-10-CM | POA: Diagnosis not present

## 2017-05-09 DIAGNOSIS — R945 Abnormal results of liver function studies: Principal | ICD-10-CM

## 2017-05-09 IMAGING — CT CT ABDOMEN W/ CM
3 of 5 series · 12 of 36 positions shown, 18 images · IV contrast (READICAT/WATER & [ID] ISOVUE 300)
Comparison: None.

CLINICAL DATA: Three day history of jaundice with elevated LFTs in
left upper quadrant pain.

EXAM:
CT ABDOMEN WITH CONTRAST
TECHNIQUE: Multidetector CT imaging of the abdomen was performed using the
standard protocol following bolus administration of intravenous
contrast.
CONTRAST:  100mL [id number] IOPAMIDOL ([id number]) INJECTION 61%

[Series 3: abdomen with · axial · 0.70mm/px · z∈[-258,-78]mm · 5 of 55 slices shown, 10 images]
[im 10/55  soft-tissue]
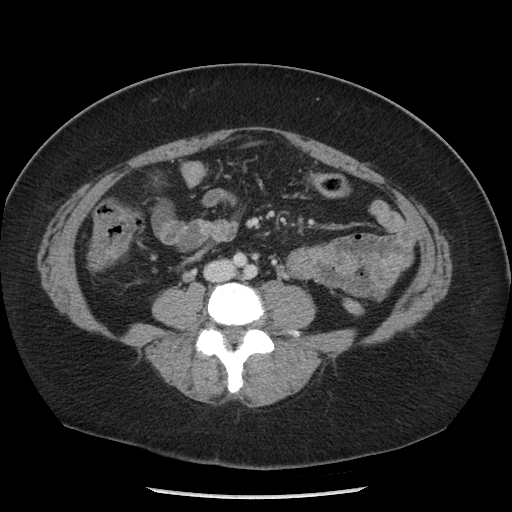
[im 10/55  bone]
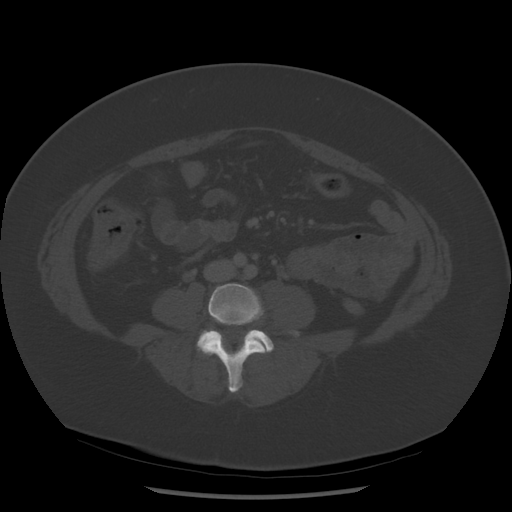
[im 19/55  soft-tissue]
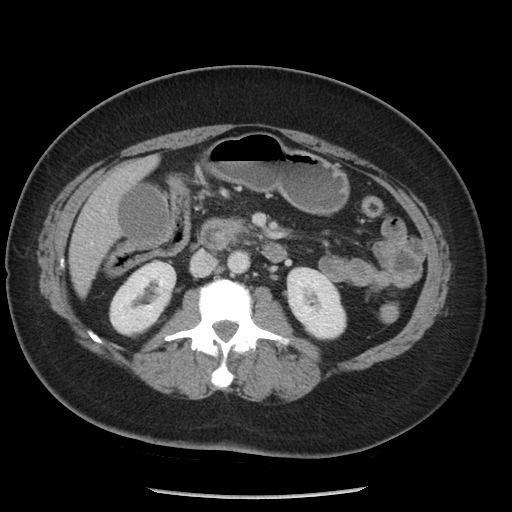
[im 19/55  lung]
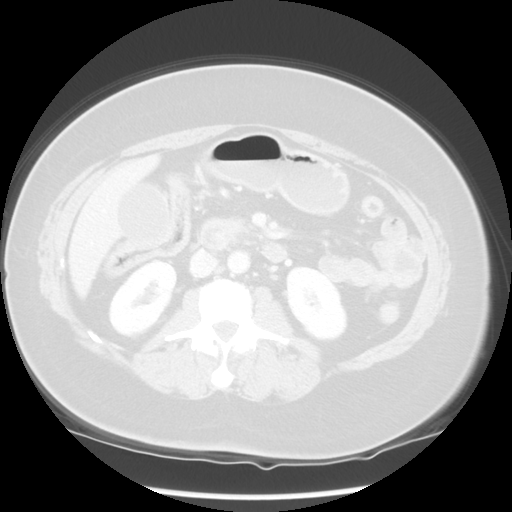
[im 28/55  soft-tissue]
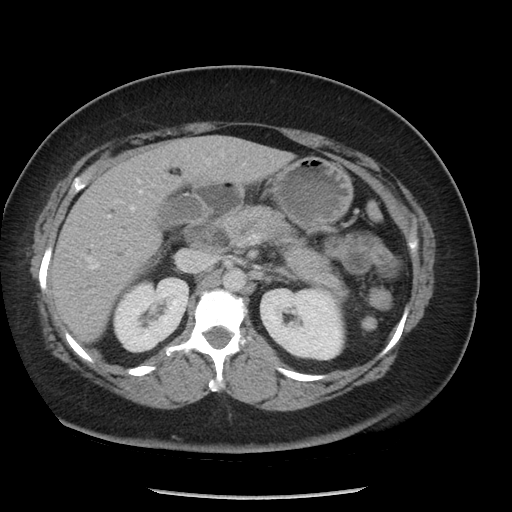
[im 28/55  lung]
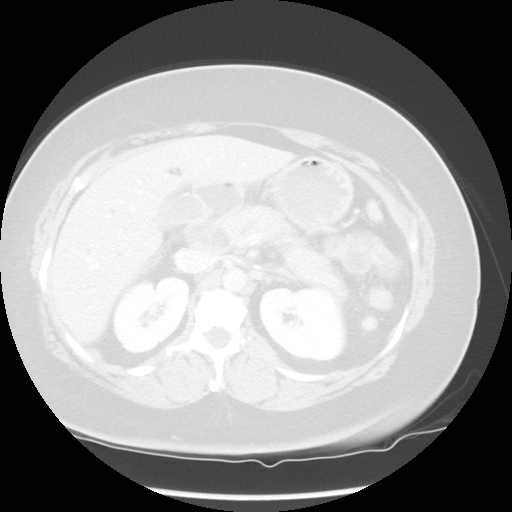
[im 37/55  soft-tissue]
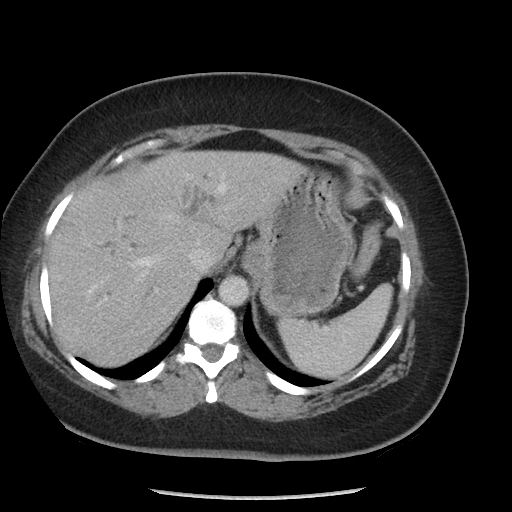
[im 37/55  lung]
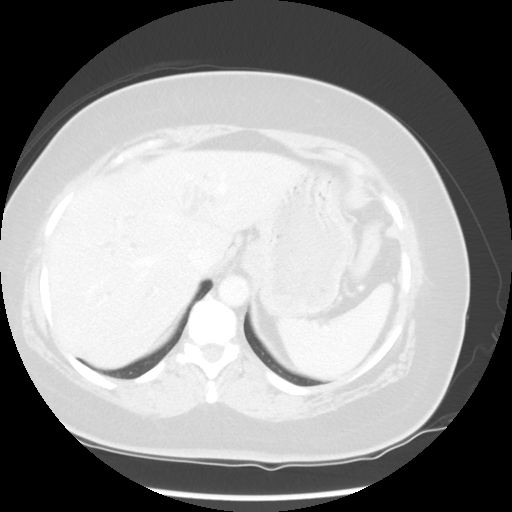
[im 46/55  soft-tissue]
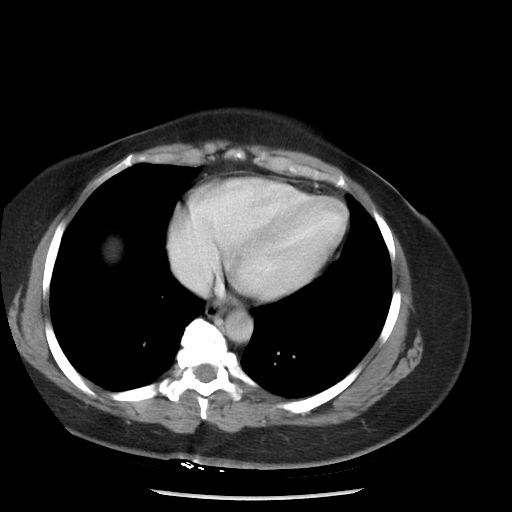
[im 46/55  lung]
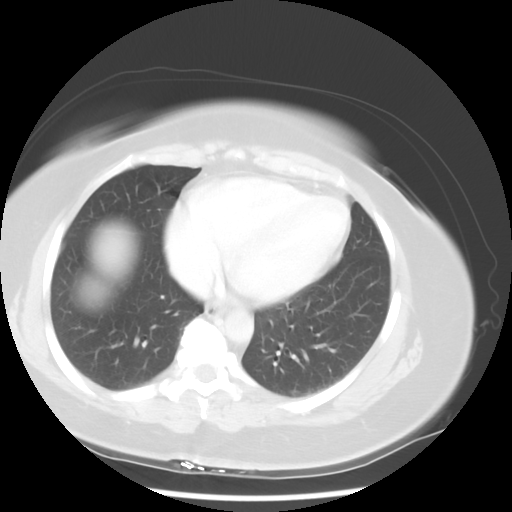

[Series 601: coronal body · coronal · 0.70mm/px · 1 of 120 slices shown, 2 images]
[im 40/120  soft-tissue]
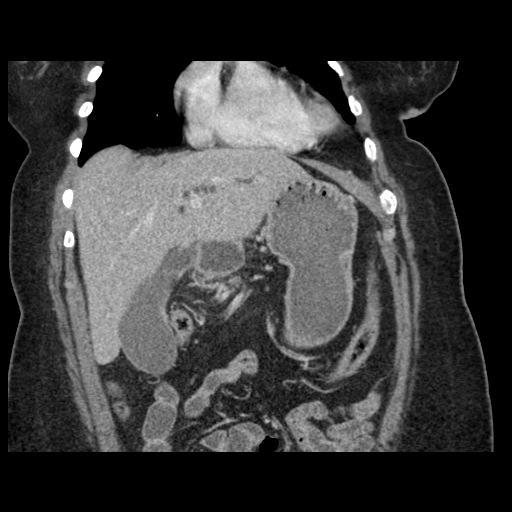
[im 40/120  bone]
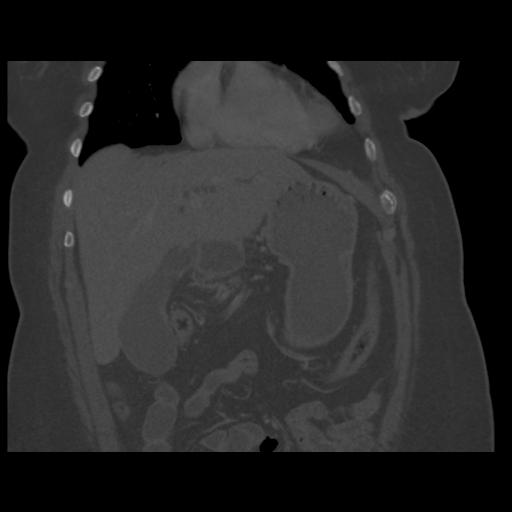

[Series 602: sagittal body · sagittal · 0.70mm/px · 6 of 145 slices shown]
[im 17/145  soft-tissue]
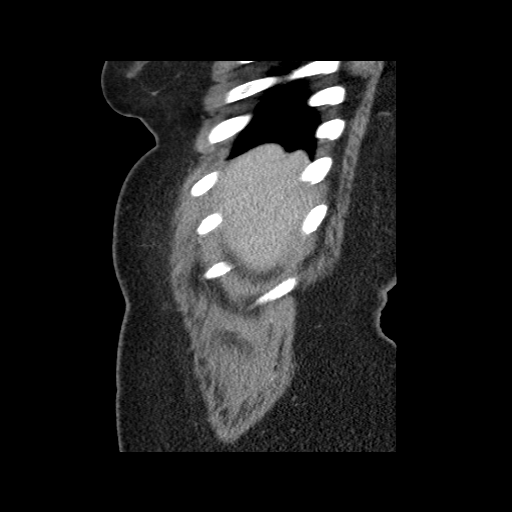
[im 33/145  soft-tissue]
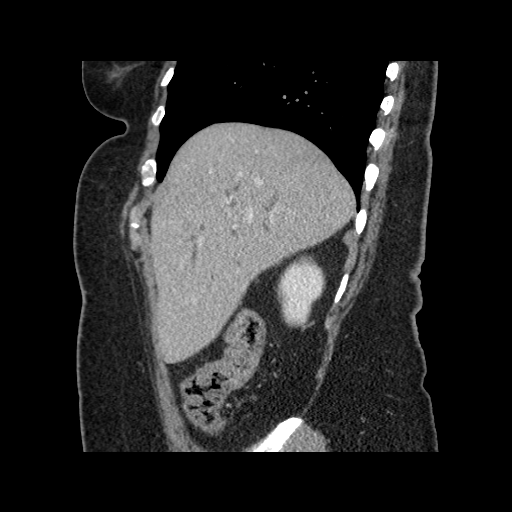
[im 49/145  soft-tissue]
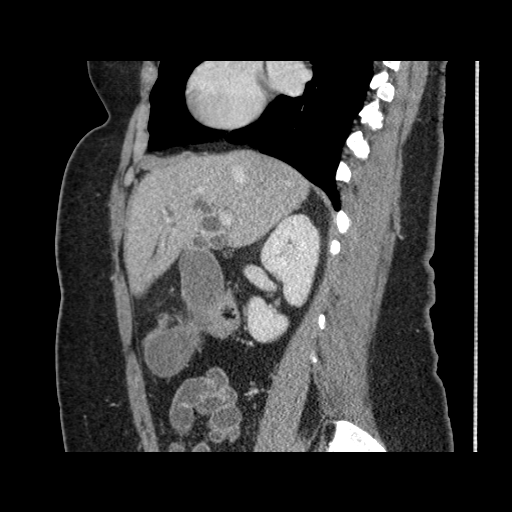
[im 65/145  soft-tissue]
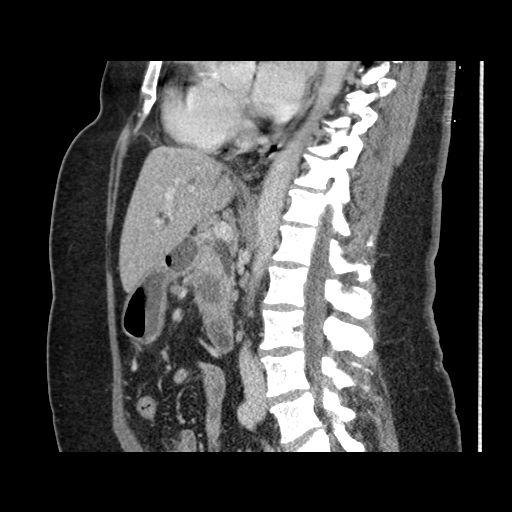
[im 81/145  soft-tissue]
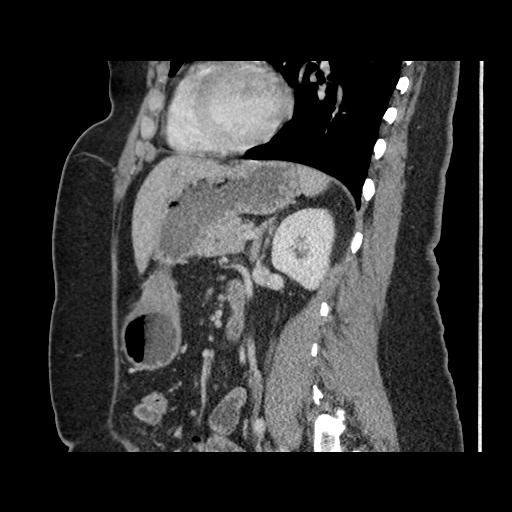
[im 97/145  soft-tissue]
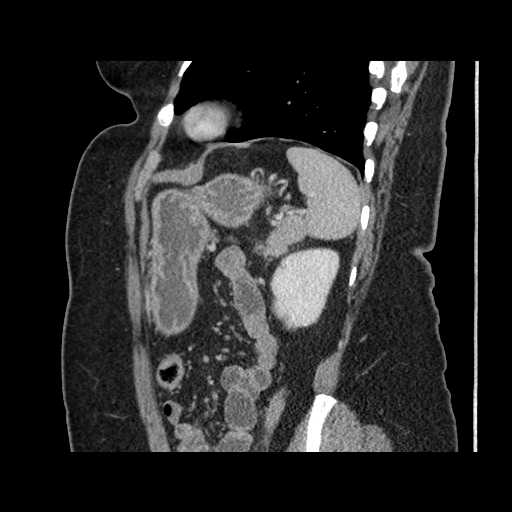

[12 of 36 positions shown; findings below may reference images not displayed]

FINDINGS: Lower chest: 7 mm lateral left breast nodule noted on image 1 series
3.

Hepatobiliary: No focal abnormality is seen in the liver parenchyma.
Intra and extrahepatic biliary duct dilatation is evident. The
extrahepatic common duct measures up to 14 mm diameter and tapers in
the head of the pancreas into the ampulla. Gallbladder is distended
and high attenuation diffusely in the gallbladder lumen is likely
sludge.

Pancreas: 1.8 x 2.5 x 2.5 cm hypoenhancing ill-defined lesion is
identified in the head of the pancreas. No dilatation of the main
pancreatic duct.

Spleen: No splenomegaly. No focal mass lesion.

Adrenals/Urinary Tract: No adrenal nodule or mass. Kidneys are
unremarkable.

Stomach/Bowel: Stomach is nondistended. No gastric wall thickening.
No evidence of outlet obstruction. Duodenum is normally positioned
as is the ligament of Treitz. Visualize small bowel loops and
colonic segments of the abdomen are nondilated.

Vascular/Lymphatic: No abdominal aortic aneurysm. Portal vein is
patent with mass-effect on the portal splenic confluence secondary
to the pancreatic head mass. Splenic vein and superior mesenteric
vein remain patent. Celiac axis and SMA are unremarkable and fat
planes around both the celiac axis and superior mesenteric artery
appear preserved. There is no gastrohepatic or hepatoduodenal
ligament lymphadenopathy. No intraperitoneal or retroperitoneal
lymphadenopathy.

Other: No intraperitoneal free fluid.

Musculoskeletal: Small umbilical hernia, incompletely visualized.
Bone windows reveal no worrisome lytic or sclerotic osseous lesions.
IMPRESSION: 1. 2.5 cm ill-defined hypoenhancing mass in the head of the pancreas
is highly suspicious for pancreatic adenocarcinoma. This is
associated with intra and extrahepatic biliary duct dilatation, but
no dilatation of the main pancreatic duct at this time.
2. Mass-effect on the portal splenic confluence due to the
pancreatic head mass, but portal vein, superior mesenteric vein, and
splenic vein remain patent.
3. No evidence for liver metastases.  No abdominal lymphadenopathy.
4. Upper endoscopy would likely prove helpful to further evaluate.
These results will be called to the ordering clinician or
representative by the Radiologist Assistant, and communication
documented in the PACS or zVision Dashboard.

## 2017-05-09 MED ORDER — IOPAMIDOL (ISOVUE-300) INJECTION 61%
100.0000 mL | Freq: Once | INTRAVENOUS | Status: AC | PRN
  Administered 2017-05-09: 100 mL via INTRAVENOUS

## 2017-05-09 MED FILL — CHOLESTYRAMINE PACKET: 4 | 30 days supply | Qty: 60 | Fill #0

## 2017-05-10 ENCOUNTER — Other Ambulatory Visit: Payer: Self-pay | Admitting: Gastroenterology

## 2017-05-11 ENCOUNTER — Encounter (HOSPITAL_COMMUNITY): Payer: Self-pay | Admitting: *Deleted

## 2017-05-12 ENCOUNTER — Ambulatory Visit (HOSPITAL_COMMUNITY)
Admission: RE | Admit: 2017-05-12 | Discharge: 2017-05-12 | Disposition: A | Discharge status: Home / Self Care | Payer: 59 | Source: Ambulatory Visit | Attending: Gastroenterology | Admitting: Gastroenterology

## 2017-05-12 ENCOUNTER — Other Ambulatory Visit: Payer: Self-pay | Admitting: Gastroenterology

## 2017-05-12 ENCOUNTER — Encounter (HOSPITAL_COMMUNITY): Payer: Self-pay | Admitting: *Deleted

## 2017-05-12 ENCOUNTER — Ambulatory Visit (HOSPITAL_COMMUNITY): Payer: 59 | Admitting: Anesthesiology

## 2017-05-12 ENCOUNTER — Telehealth: Payer: Self-pay

## 2017-05-12 ENCOUNTER — Encounter (HOSPITAL_COMMUNITY)
Admission: RE | Disposition: A | Discharge status: Home / Self Care | Payer: Self-pay | Source: Ambulatory Visit | Attending: Gastroenterology

## 2017-05-12 ENCOUNTER — Ambulatory Visit (HOSPITAL_COMMUNITY): Payer: 59

## 2017-05-12 DIAGNOSIS — K831 Obstruction of bile duct: Secondary | ICD-10-CM | POA: Diagnosis not present

## 2017-05-12 DIAGNOSIS — K8689 Other specified diseases of pancreas: Secondary | ICD-10-CM

## 2017-05-12 DIAGNOSIS — R109 Unspecified abdominal pain: Secondary | ICD-10-CM | POA: Diagnosis present

## 2017-05-12 DIAGNOSIS — C250 Malignant neoplasm of head of pancreas: Secondary | ICD-10-CM | POA: Insufficient documentation

## 2017-05-12 DIAGNOSIS — R933 Abnormal findings on diagnostic imaging of other parts of digestive tract: Secondary | ICD-10-CM | POA: Diagnosis not present

## 2017-05-12 DIAGNOSIS — E7800 Pure hypercholesterolemia, unspecified: Secondary | ICD-10-CM | POA: Insufficient documentation

## 2017-05-12 DIAGNOSIS — L299 Pruritus, unspecified: Secondary | ICD-10-CM | POA: Diagnosis not present

## 2017-05-12 DIAGNOSIS — R748 Abnormal levels of other serum enzymes: Secondary | ICD-10-CM | POA: Diagnosis not present

## 2017-05-12 DIAGNOSIS — K219 Gastro-esophageal reflux disease without esophagitis: Secondary | ICD-10-CM | POA: Insufficient documentation

## 2017-05-12 DIAGNOSIS — Z7982 Long term (current) use of aspirin: Secondary | ICD-10-CM | POA: Diagnosis not present

## 2017-05-12 DIAGNOSIS — R7989 Other specified abnormal findings of blood chemistry: Secondary | ICD-10-CM | POA: Insufficient documentation

## 2017-05-12 DIAGNOSIS — K838 Other specified diseases of biliary tract: Secondary | ICD-10-CM | POA: Insufficient documentation

## 2017-05-12 DIAGNOSIS — Z79899 Other long term (current) drug therapy: Secondary | ICD-10-CM | POA: Diagnosis not present

## 2017-05-12 DIAGNOSIS — I10 Essential (primary) hypertension: Secondary | ICD-10-CM | POA: Diagnosis not present

## 2017-05-12 HISTORY — PX: FINE NEEDLE ASPIRATION: SHX5430

## 2017-05-12 HISTORY — PX: ERCP: SHX5425

## 2017-05-12 HISTORY — PX: EUS: SHX5427

## 2017-05-12 IMAGING — RF DG ERCP WO/W SPHINCTEROTOMY
1 series · 3 of 3 positions shown · non-contrast
Comparison: CT abdomen/pelvis 05/09/2017

CLINICAL DATA: 60-year-old female undergoing ERCP

EXAM:
ERCP
TECHNIQUE: Multiple spot images obtained with the fluoroscopic device and
submitted for interpretation post-procedure.
FLUOROSCOPY TIME:  Fluoroscopy Time:  0 minutes 29 seconds
Radiation Exposure Index (if provided by the fluoroscopic device):
3.6 mGy

[Series 1: run · 3 of 6 frames shown]
[frame 1/6]
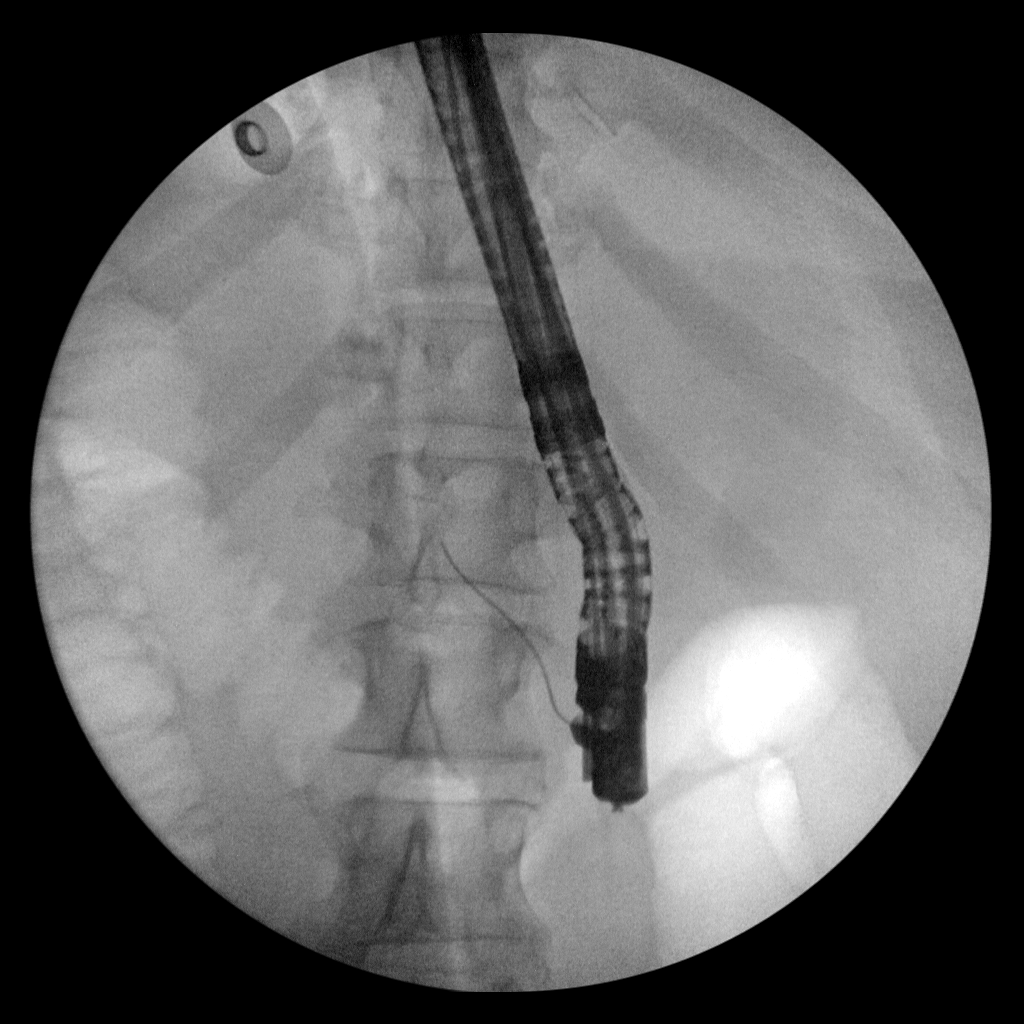
[frame 4/6]
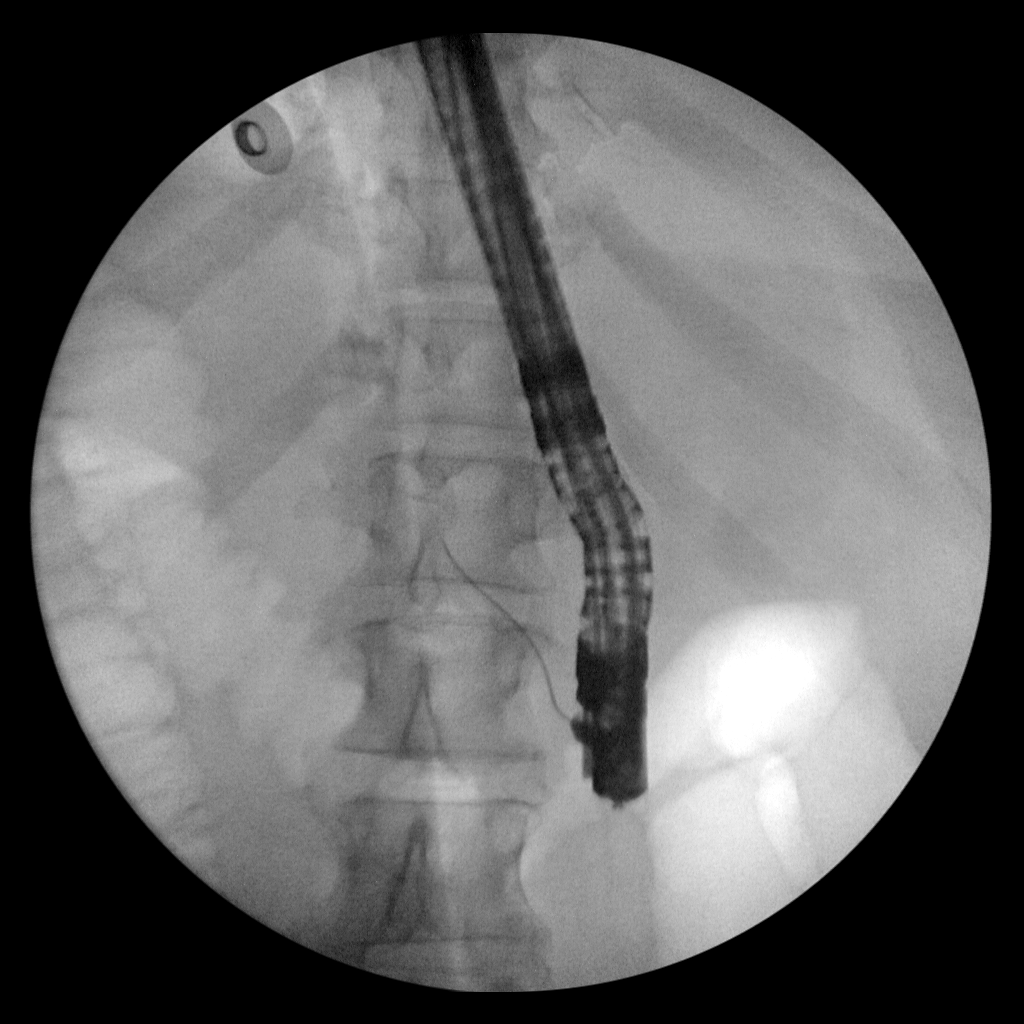
[frame 6/6]
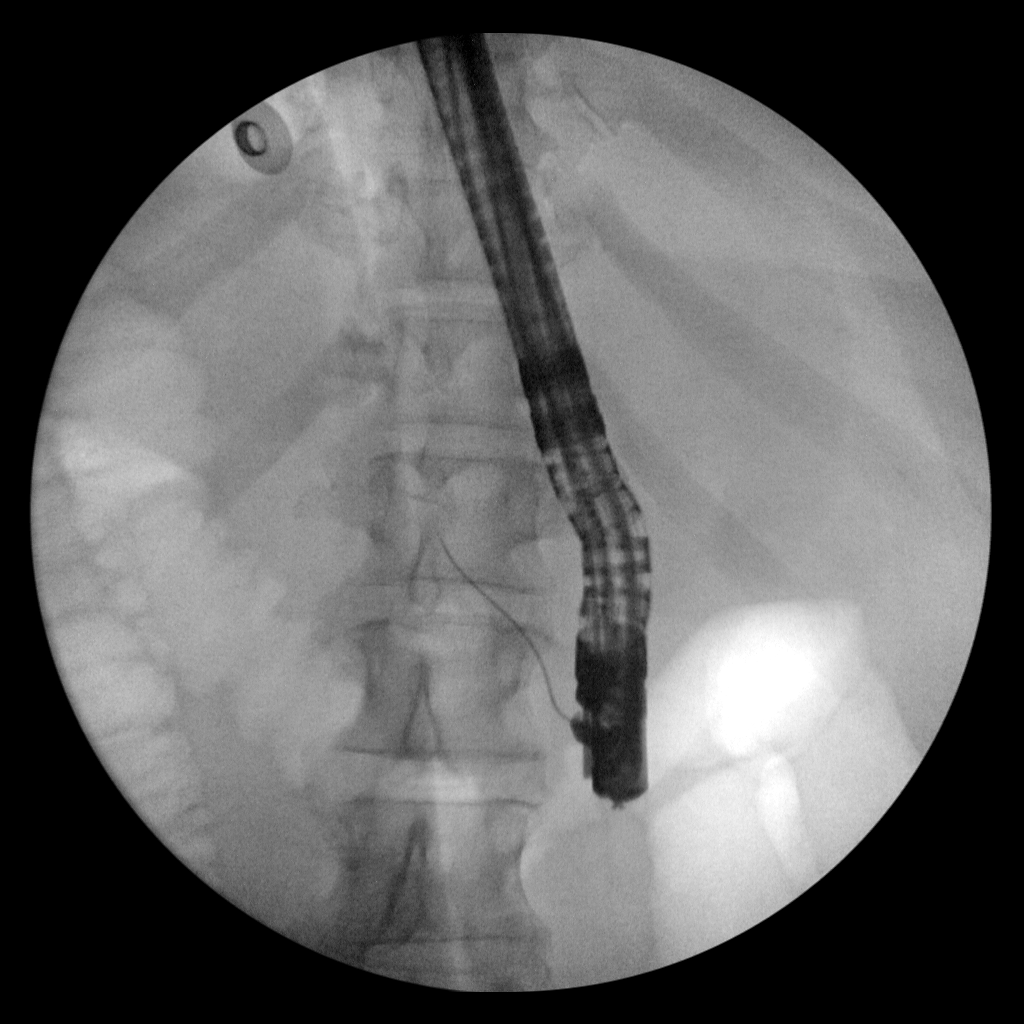

[3 of 3 positions shown; findings below may reference images not displayed]

FINDINGS: Two intraoperative spot images demonstrate a flexible endoscope in
the descending duodenum. No opacification of the common bile duct.
IMPRESSION: Limited ERCP images without opacification of the common bile duct.

These images were submitted for radiologic interpretation only.
Please see the procedural report for the amount of contrast and the
fluoroscopy time utilized.

## 2017-05-12 SURGERY — ESOPHAGEAL ENDOSCOPIC ULTRASOUND (EUS) RADIAL
Anesthesia: General

## 2017-05-12 MED ORDER — INDOMETHACIN 50 MG RE SUPP
RECTAL | Status: AC
  Filled 2017-05-12: qty 2

## 2017-05-12 MED ORDER — ONDANSETRON HCL 4 MG/2ML IJ SOLN
INTRAMUSCULAR | Status: DC | PRN
  Administered 2017-05-12: 4 mg via INTRAVENOUS

## 2017-05-12 MED ORDER — CIPROFLOXACIN IN D5W 400 MG/200ML IV SOLN
INTRAVENOUS | Status: AC
  Filled 2017-05-12: qty 200

## 2017-05-12 MED ORDER — FENTANYL CITRATE (PF) 100 MCG/2ML IJ SOLN
INTRAMUSCULAR | Status: AC
  Filled 2017-05-12: qty 2

## 2017-05-12 MED ORDER — SODIUM CHLORIDE 0.9 % IV SOLN: INTRAVENOUS | Status: DC

## 2017-05-12 MED ORDER — INDOMETHACIN 50 MG RE SUPP
RECTAL | Status: DC | PRN
  Administered 2017-05-12: 100 mg via RECTAL

## 2017-05-12 MED ORDER — ROCURONIUM BROMIDE 10 MG/ML (PF) SYRINGE
PREFILLED_SYRINGE | INTRAVENOUS | Status: DC | PRN
  Administered 2017-05-12 (×2): 10 mg via INTRAVENOUS
  Administered 2017-05-12: 40 mg via INTRAVENOUS
  Administered 2017-05-12: 10 mg via INTRAVENOUS

## 2017-05-12 MED ORDER — CHOLESTYRAMINE 4 G PO PACK: 8.0000 g | PACK | Freq: Two times a day (BID) | ORAL | 2 refills | Status: DC

## 2017-05-12 MED ORDER — FENTANYL CITRATE (PF) 100 MCG/2ML IJ SOLN
INTRAMUSCULAR | Status: DC | PRN
Start: 2017-05-12 — End: 2017-05-12
  Administered 2017-05-12 (×3): 50 ug via INTRAVENOUS

## 2017-05-12 MED ORDER — CIPROFLOXACIN IN D5W 400 MG/200ML IV SOLN
INTRAVENOUS | Status: DC | PRN
  Administered 2017-05-12: 400 mg via INTRAVENOUS

## 2017-05-12 MED ORDER — LIDOCAINE 2% (20 MG/ML) 5 ML SYRINGE
INTRAMUSCULAR | Status: DC | PRN
  Administered 2017-05-12: 50 mg via INTRAVENOUS

## 2017-05-12 MED ORDER — ONDANSETRON HCL 4 MG/2ML IJ SOLN
INTRAMUSCULAR | Status: AC
  Filled 2017-05-12: qty 2

## 2017-05-12 MED ORDER — LIDOCAINE 2% (20 MG/ML) 5 ML SYRINGE
INTRAMUSCULAR | Status: AC
  Filled 2017-05-12: qty 5

## 2017-05-12 MED ORDER — DEXAMETHASONE SODIUM PHOSPHATE 10 MG/ML IJ SOLN
INTRAMUSCULAR | Status: DC | PRN
  Administered 2017-05-12: 10 mg via INTRAVENOUS

## 2017-05-12 MED ORDER — PROPOFOL 10 MG/ML IV BOLUS
INTRAVENOUS | Status: DC | PRN
  Administered 2017-05-12: 150 mg via INTRAVENOUS

## 2017-05-12 MED ORDER — ROCURONIUM BROMIDE 50 MG/5ML IV SOSY
PREFILLED_SYRINGE | INTRAVENOUS | Status: AC
  Filled 2017-05-12: qty 5

## 2017-05-12 MED ORDER — PHENYLEPHRINE 40 MCG/ML (10ML) SYRINGE FOR IV PUSH (FOR BLOOD PRESSURE SUPPORT)
PREFILLED_SYRINGE | INTRAVENOUS | Status: AC
  Filled 2017-05-12: qty 10

## 2017-05-12 MED ORDER — LACTATED RINGERS IV SOLN
INTRAVENOUS | Status: DC
  Administered 2017-05-12: 1000 mL via INTRAVENOUS

## 2017-05-12 MED ORDER — DEXAMETHASONE SODIUM PHOSPHATE 10 MG/ML IJ SOLN
INTRAMUSCULAR | Status: AC
  Filled 2017-05-12: qty 1

## 2017-05-12 MED ORDER — SUGAMMADEX SODIUM 200 MG/2ML IV SOLN
INTRAVENOUS | Status: AC
  Filled 2017-05-12: qty 2

## 2017-05-12 MED ORDER — PHENYLEPHRINE 40 MCG/ML (10ML) SYRINGE FOR IV PUSH (FOR BLOOD PRESSURE SUPPORT)
PREFILLED_SYRINGE | INTRAVENOUS | Status: DC | PRN
  Administered 2017-05-12 (×3): 40 ug via INTRAVENOUS
  Administered 2017-05-12 (×2): 80 ug via INTRAVENOUS

## 2017-05-12 MED ORDER — GLUCAGON HCL RDNA (DIAGNOSTIC) 1 MG IJ SOLR
INTRAMUSCULAR | Status: AC
  Filled 2017-05-12: qty 1

## 2017-05-12 MED ORDER — PROPOFOL 10 MG/ML IV BOLUS
INTRAVENOUS | Status: AC
  Filled 2017-05-12: qty 20

## 2017-05-12 MED ORDER — MIDAZOLAM HCL 5 MG/5ML IJ SOLN
INTRAMUSCULAR | Status: DC | PRN
  Administered 2017-05-12: 2 mg via INTRAVENOUS

## 2017-05-12 MED ORDER — SUGAMMADEX SODIUM 200 MG/2ML IV SOLN
INTRAVENOUS | Status: DC | PRN
  Administered 2017-05-12: 160 mg via INTRAVENOUS

## 2017-05-12 MED ORDER — MIDAZOLAM HCL 2 MG/2ML IJ SOLN
INTRAMUSCULAR | Status: AC
  Filled 2017-05-12: qty 2

## 2017-05-12 NOTE — Discharge Instructions (Signed)

## 2017-05-12 NOTE — Telephone Encounter (Signed)
FMLA papers placed in your folder for completion. Pt states she only needs you to fill out the paperwork for Monday 05/09/2017, Dr. Paulita Fujita will complete a form for the rest of her leave. Victorino December

## 2017-05-12 NOTE — Transfer of Care (Signed)
Immediate Anesthesia Transfer of Care Note  Patient: LASHAYE FISK  Procedure(s) Performed: Procedure(s): ESOPHAGEAL ENDOSCOPIC ULTRASOUND (EUS) RADIAL (N/A) FINE NEEDLE ASPIRATION (FNA) RADIAL (N/A) ENDOSCOPIC RETROGRADE CHOLANGIOPANCREATOGRAPHY (ERCP) (N/A)  Patient Location: PACU  Anesthesia Type:General  Level of Consciousness:  sedated, patient cooperative and responds to stimulation  Airway & Oxygen Therapy:Patient Spontanous Breathing and Patient connected to face mask oxgen  Post-op Assessment:  Report given to PACU RN and Post -op Vital signs reviewed and stable  Post vital signs:  Reviewed and stable  Last Vitals:  Vitals:   05/12/17 1240  BP: 127/64  Pulse: 91  Resp: 17  Temp: 73.4 C    Complications: No apparent anesthesia complications

## 2017-05-12 NOTE — Anesthesia Procedure Notes (Signed)
Procedure Name: Intubation Date/Time: 05/12/2017 2:03 PM Performed by: Anne Fu Pre-anesthesia Checklist: Patient identified, Emergency Drugs available, Suction available, Patient being monitored and Timeout performed Patient Re-evaluated:Patient Re-evaluated prior to inductionOxygen Delivery Method: Circle system utilized Preoxygenation: Pre-oxygenation with 100% oxygen Intubation Type: IV induction Ventilation: Mask ventilation without difficulty Laryngoscope Size: Mac and 4 Grade View: Grade I Tube type: Oral Tube size: 7.5 mm Number of attempts: 1 Airway Equipment and Method: Stylet Placement Confirmation: ETT inserted through vocal cords under direct vision,  positive ETCO2,  CO2 detector and breath sounds checked- equal and bilateral Secured at: 19 cm Tube secured with: Tape Dental Injury: Teeth and Oropharynx as per pre-operative assessment

## 2017-05-12 NOTE — Op Note (Signed)
[facility] Patient Name: Megan Jordan Procedure Date: 05/12/2017 MRN: [id number] Attending MD: Arta Silence , MD Date of Birth: 11/22/1956 CSN: [id number] Age: 61 Admit Type: Outpatient Procedure:                ERCP Indications:              Biliary dilation on Computed Tomogram Scan,                            Pancreatic tumor on Computed Tomogram Scan,                            Jaundice, Elevated liver enzymes Providers:                Arta Silence, MD, Otis Brace, MD, Cleda Daub, RN, Tinnie Gens, Technician Referring MD:             Molli Posey, MD Medicines:                General Anesthesia, Indomethacin 100 mg PR, Cipro                            321 mg IV Complications:            No immediate complications. Estimated blood loss:                            Minimal. Estimated Blood Loss:     Estimated blood loss was minimal. Procedure:                Pre-Anesthesia Assessment:                           - Prior to the procedure, a History and Physical                            was performed, and patient medications and                            allergies were reviewed. The patient's tolerance of                            previous anesthesia was also reviewed. The risks                            and benefits of the procedure and the sedation                            options and risks were discussed with the patient.                            All questions were answered, and informed consent                            was  obtained. Prior Anticoagulants: The patient has                            taken aspirin. ASA Grade Assessment: II - A patient                            with mild systemic disease. After reviewing the                            risks and benefits, the patient was deemed in                            satisfactory condition to undergo the procedure.                           - Prior to the  procedure, a History and Physical                            was performed, and patient medications and                            allergies were reviewed. The patient's tolerance of                            previous anesthesia was also reviewed. The risks                            and benefits of the procedure and the sedation                            options and risks were discussed with the patient.                            All questions were answered, and informed consent                            was obtained. Prior Anticoagulants: The patient has                            taken aspirin. ASA Grade Assessment: II - A patient                            with mild systemic disease. After reviewing the                            risks and benefits, the patient was deemed in                            satisfactory condition to undergo the procedure.                           After obtaining informed consent, the scope was  passed under direct vision. Throughout the                            procedure, the patient's blood pressure, pulse, and                            oxygen saturations were monitored continuously. The                            [id number] ([id number]) scope was introduced through                            the mouth, The procedure was aborted. The scope was                            not inserted. bile ducts Medications were duodenum.                            The ERCP was technically difficult and complex due                            to challenging cannulation because of abnormal                            anatomy, challenging cannulation, challenging                            cannulation because of inability to visualize the                            major papilla and duodenal stenosis. The patient                            tolerated the procedure well. Scope In: Scope Out: Findings:      Procedure performed jointly by Dr. Otis Brace and myself. I was       present during all parts of the procedure and was responsible for       stopping the procedure. The scout film was normal. Limited side-viewing       looks at the esophagus, stomach, pylorus and duodenal bulb were normal.       There was some post-bulbar edema and what looked like infiltrative tumor       in this location. With time, the side-viewing duodenoscope was able to       reach the descending duodenum. There was a bulbous region which I       thought originally might have been the ampulla, but couldn't locate an       opening despite extensive efforts. After several minutes of looking, I       saw an erythematous and nodular region that might have housed a an       ampulla orifice. There was less than a few mm between this region and       the optics of the scope; I couldn't get a greater distance, due to all       the neighboring edema, thus my ability to  properly engage this area was       significantly compromised. I was able to use the small sphincterotome to       pass a guidewire into what appeared to be the pancreas. I left this wire       in to help try to cannulate the bile duct; however, once I did this, I       was not able to view the presumed ampullary orifice again; there simply       was too much edema and presumed invasive tumor. Procedure aborted at       this point. Impression:               - Post-bulbar edema and suspected infiltrative                            tumor, significantly compromising views.                           - Unable to definitively view the ampulla, due to                            the extensive edema.                           - ERCP aborted. Moderate Sedation:      None Recommendation:           - Avoid aspirin and nonsteroidal anti-inflammatory                            medicines for 3 days.                           - Await cytology results.                           - Watch for pancreatitis,  bleeding, perforation,                            and cholangitis.                           - Options at this point include retry ERCP with one                            of my partners versus percutaneous drain placement.                           - Based on CT and EUS, the patient's pancreatic                            tumor appears resectable, thus will initiate                            surgical referral. Procedure Code(s):        --- Professional ---  43260, 52, Endoscopic retrograde                            cholangiopancreatography (ERCP); diagnostic,                            including collection of specimen(s) by brushing or                            washing, when performed (separate procedure) Diagnosis Code(s):        --- Professional ---                           D49.0, Neoplasm of unspecified behavior of                            digestive system                           R17, Unspecified jaundice                           R74.8, Abnormal levels of other serum enzymes                           K83.8, Other specified diseases of biliary tract CPT copyright 2016 [employer]. All rights reserved. The codes documented in this report are preliminary and upon coder review may  be revised to meet current compliance requirements. Arta Silence, MD 05/12/2017 4:37:39 PM This report has been signed electronically. Number of Addenda: 0

## 2017-05-12 NOTE — Op Note (Signed)
[facility] Patient Name: Megan Jordan Procedure Date: 05/12/2017 MRN: [id number] Attending MD: Arta Silence , MD Date of Birth: 11/14/1956 CSN: [id number] Age: 61 Admit Type: Outpatient Procedure:                Upper EUS Indications:              Common bile duct dilation (acquired) seen on CT                            scan, Suspected mass in pancreas on CT scan,                            Obstruction of bile duct on CT, Elevated liver                            enzymes, Suspected solid pancreatic neoplasm Providers:                Arta Silence, MD, Cleda Daub, RN, Tinnie Gens,                            Technician Referring MD:             Molli Posey, MD Medicines:                General Anesthesia Complications:            No immediate complications. Estimated Blood Loss:     Estimated blood loss was minimal. Procedure:                Pre-Anesthesia Assessment:                           - Prior to the procedure, a History and Physical                            was performed, and patient medications and                            allergies were reviewed. The patient's tolerance of                            previous anesthesia was also reviewed. The risks                            and benefits of the procedure and the sedation                            options and risks were discussed with the patient.                            All questions were answered, and informed consent                            was obtained. Prior Anticoagulants: The patient has  taken aspirin. ASA Grade Assessment: II - A patient                            with mild systemic disease. After reviewing the                            risks and benefits, the patient was deemed in                            satisfactory condition to undergo the procedure.                           After obtaining informed consent, the endoscope was         passed under direct vision. Throughout the                            procedure, the patient's blood pressure, pulse, and                            oxygen saturations were monitored continuously. The                            [id number] ([id number]) scope was introduced through                            the mouth, and advanced to the second part of                            duodenum. The upper EUS was accomplished without                            difficulty. The patient tolerated the procedure                            well. Scope In: Scope Out: Findings:      Endosonographic Finding :      There was no sign of significant endosonographic abnormality in the left       lobe of the liver.      Moderate hyperechoic material consistent with sludge was visualized       endosonographically in the gallbladder.      There was dilation in the common bile duct, in the main bile duct, in       the common hepatic duct and diffusely throughout the intrahepatic bile       duct(s) which measured up to 12 mm.      No lymphadenopathy seen.      An irregular mass was identified in the pancreatic head. The mass was       hypoechoic. The mass measured 20 mm by 20 mm in maximal cross-sectional       diameter. The endosonographic borders were poorly-defined. An intact       interface was seen between the mass and the superior mesenteric artery,       portal vein, superior mesenteric vein and splenic vein suggesting a lack       of invasion. The remainder  of the pancreas was examined. The       endosonographic appearance of parenchyma and the upstream pancreatic       duct indicated duct dilation. Fine needle aspiration for cytology was       performed. Color Doppler imaging was utilized prior to needle puncture       to confirm a lack of significant vascular structures within the needle       path. Three passes were made with the 25 gauge needle using a       transduodenal approach. A stylet was  used. A cytologist was present and       performed a preliminary cytologic examination. Preliminary cytology is       suspicious for adenocarcinoma (final results are pending). Estimated       blood loss was minimal. Impression:               - There was no evidence of significant pathology in                            the left lobe of the liver.                           - Hyperechoic material consistent with sludge was                            visualized endosonographically in the gallbladder.                           - There was dilation in the common bile duct, in                            the entire main bile duct, in the common hepatic                            duct and in the intrahepatic bile ducts, diffusely                            which measured up to 12 mm.                           - A mass was identified in the pancreatic head.                            This was staged T2 N0 Mx by endosonographic                            criteria. Fine needle aspiration performed. Moderate Sedation:      None Recommendation:           - Await cytology results.                           - Perform an ERCP today. Procedure Code(s):        --- Professional ---                           [id number],  Esophagogastroduodenoscopy, flexible,                            transoral; with transendoscopic ultrasound-guided                            intramural or transmural fine needle                            aspiration/biopsy(s) (includes endoscopic                            ultrasound examination of the esophagus, stomach,                            and either the duodenum or a surgically altered                            stomach where the jejunum is examined distal to the                            anastomosis) Diagnosis Code(s):        --- Professional ---                           K83.8, Other specified diseases of biliary tract                           K86.89, Other specified diseases of  pancreas                           K83.1, Obstruction of bile duct                           R74.8, Abnormal levels of other serum enzymes                           R93.3, Abnormal findings on diagnostic imaging of                            other parts of digestive tract CPT copyright 2016 American [employer] Association. All rights reserved. The codes documented in this report are preliminary and upon coder review may  be revised to meet current compliance requirements. Arta Silence, MD 05/12/2017 3:17:11 PM This report has been signed electronically. Number of Addenda: 0

## 2017-05-12 NOTE — Anesthesia Postprocedure Evaluation (Signed)
Anesthesia Post Note  Patient: Megan Jordan  Procedure(s) Performed: Procedure(s) (LRB): ESOPHAGEAL ENDOSCOPIC ULTRASOUND (EUS) RADIAL (N/A) FINE NEEDLE ASPIRATION (FNA) RADIAL (N/A) ENDOSCOPIC RETROGRADE CHOLANGIOPANCREATOGRAPHY (ERCP) (N/A)     Patient location during evaluation: PACU Anesthesia Type: General Level of consciousness: awake and alert Pain management: pain level controlled Vital Signs Assessment: post-procedure vital signs reviewed and stable Respiratory status: spontaneous breathing, nonlabored ventilation, respiratory function stable and patient connected to nasal cannula oxygen Cardiovascular status: blood pressure returned to baseline and stable Postop Assessment: no signs of nausea or vomiting Anesthetic complications: no    Last Vitals:  Vitals:   05/12/17 1650 05/12/17 1700  BP: (!) 141/67 121/86  Pulse: 89 84  Resp: 11 12  Temp:      Last Pain:  Vitals:   05/12/17 1633  TempSrc: Oral                 [name] S

## 2017-05-12 NOTE — Anesthesia Preprocedure Evaluation (Addendum)
Anesthesia Evaluation  Patient identified by MRN, date of birth, ID band Patient awake    Reviewed: Allergy & Precautions, NPO status , Patient's Chart, lab work & pertinent test results  Airway Mallampati: I       Dental  (+) Dental Advisory Given, Partial Upper   Pulmonary neg pulmonary ROS,    breath sounds clear to auscultation       Cardiovascular hypertension, Pt. on medications  Rhythm:Regular Rate:Normal     Neuro/Psych negative neurological ROS  negative psych ROS   GI/Hepatic Neg liver ROS, GERD  Medicated,  Endo/Other  negative endocrine ROS  Renal/GU negative Renal ROS  negative genitourinary   Musculoskeletal negative musculoskeletal ROS (+)   Abdominal   Peds negative pediatric ROS (+)  Hematology   Anesthesia Other Findings - HLD  Reproductive/Obstetrics negative OB ROS                           Lab Results  Component Value Date   WBC 5.6 05/06/2017   HGB 10.6 (L) 05/06/2017   HCT 32.6 (L) 05/06/2017   MCV 85.6 05/06/2017   PLT 282 05/06/2017   EKG: sinus bradycardia   Anesthesia Physical Anesthesia Plan  ASA: II  Anesthesia Plan: General   Post-op Pain Management:    Induction: Intravenous  PONV Risk Score and Plan: 3 and Ondansetron, Dexamethasone, Propofol and Midazolam  Airway Management Planned:   Additional Equipment:   Intra-op Plan:   Post-operative Plan: Extubation in OR  Informed Consent: I have reviewed the patients History and Physical, chart, labs and discussed the procedure including the risks, benefits and alternatives for the proposed anesthesia with the patient or authorized representative who has indicated his/her understanding and acceptance.   Dental advisory given  Plan Discussed with: CRNA  Anesthesia Plan Comments:         Anesthesia Quick Evaluation

## 2017-05-12 NOTE — H&P (Signed)
Patient interval history reviewed.  Patient examined again.  There has been no change from documented H/P dated 05/09/17 (scanned into chart from our office) except as documented above.  Assessment:  1.  Obstructive jaundice. 2.  Elevated LFTs. 3.  Pruritus. 4.  Pancreatic mass.  Plan:  1.  Endoscopic ultrasound with possible fine needle aspiration. 2.  Risks (bleeding, infection, bowel perforation that could require surgery, sedation-related changes in cardiopulmonary systems), benefits (identification and possible treatment of source of symptoms, exclusion of certain causes of symptoms), and alternatives (watchful waiting, radiographic imaging studies, empiric medical treatment) of upper endoscopy with ultrasound and possible fine needle aspiration (EUS +/- FNA) were explained to patient/family in detail and patient wishes to proceed. 3.  Endoscopic retrograde cholangiopancreatography with anticipated biliary stent placement. 4.  Risks (up to and including bleeding, infection, perforation, pancreatitis that can be complicated by infected necrosis and death), benefits (removal of stones, alleviating blockage, decreasing risk of cholangitis or choledocholithiasis-related pancreatitis), and alternatives (watchful waiting, percutaneous transhepatic cholangiography) of ERCP were explained to patient/family in detail and patient elects to proceed.

## 2017-05-13 ENCOUNTER — Encounter (HOSPITAL_COMMUNITY): Payer: Self-pay | Admitting: Gastroenterology

## 2017-05-13 ENCOUNTER — Other Ambulatory Visit: Payer: Self-pay | Admitting: Medical

## 2017-05-13 MED FILL — TRIAMCINOLONE 0.1% CREAM: 0.1 | 7 days supply | Qty: 30 | Fill #0

## 2017-05-16 DIAGNOSIS — K828 Other specified diseases of gallbladder: Secondary | ICD-10-CM | POA: Diagnosis not present

## 2017-05-16 DIAGNOSIS — K315 Obstruction of duodenum: Secondary | ICD-10-CM | POA: Diagnosis not present

## 2017-05-16 DIAGNOSIS — K838 Other specified diseases of biliary tract: Secondary | ICD-10-CM | POA: Diagnosis not present

## 2017-05-16 DIAGNOSIS — I10 Essential (primary) hypertension: Secondary | ICD-10-CM | POA: Diagnosis not present

## 2017-05-16 DIAGNOSIS — K219 Gastro-esophageal reflux disease without esophagitis: Secondary | ICD-10-CM | POA: Diagnosis not present

## 2017-05-16 DIAGNOSIS — R945 Abnormal results of liver function studies: Secondary | ICD-10-CM | POA: Diagnosis not present

## 2017-05-16 DIAGNOSIS — D490 Neoplasm of unspecified behavior of digestive system: Secondary | ICD-10-CM | POA: Diagnosis not present

## 2017-05-16 DIAGNOSIS — R932 Abnormal findings on diagnostic imaging of liver and biliary tract: Secondary | ICD-10-CM | POA: Diagnosis not present

## 2017-05-16 DIAGNOSIS — C170 Malignant neoplasm of duodenum: Secondary | ICD-10-CM | POA: Diagnosis not present

## 2017-05-16 DIAGNOSIS — C787 Secondary malignant neoplasm of liver and intrahepatic bile duct: Secondary | ICD-10-CM | POA: Diagnosis not present

## 2017-05-16 DIAGNOSIS — R748 Abnormal levels of other serum enzymes: Secondary | ICD-10-CM | POA: Diagnosis not present

## 2017-05-16 DIAGNOSIS — Z5309 Procedure and treatment not carried out because of other contraindication: Secondary | ICD-10-CM | POA: Diagnosis not present

## 2017-05-16 DIAGNOSIS — K831 Obstruction of bile duct: Secondary | ICD-10-CM | POA: Diagnosis not present

## 2017-05-16 DIAGNOSIS — K3189 Other diseases of stomach and duodenum: Secondary | ICD-10-CM | POA: Diagnosis not present

## 2017-05-16 DIAGNOSIS — Z01810 Encounter for preprocedural cardiovascular examination: Secondary | ICD-10-CM | POA: Diagnosis not present

## 2017-05-16 DIAGNOSIS — K869 Disease of pancreas, unspecified: Secondary | ICD-10-CM | POA: Diagnosis not present

## 2017-05-16 DIAGNOSIS — C250 Malignant neoplasm of head of pancreas: Secondary | ICD-10-CM | POA: Diagnosis not present

## 2017-05-16 DIAGNOSIS — R933 Abnormal findings on diagnostic imaging of other parts of digestive tract: Secondary | ICD-10-CM | POA: Diagnosis not present

## 2017-05-16 DIAGNOSIS — R17 Unspecified jaundice: Secondary | ICD-10-CM | POA: Diagnosis not present

## 2017-05-16 DIAGNOSIS — E785 Hyperlipidemia, unspecified: Secondary | ICD-10-CM | POA: Diagnosis not present

## 2017-05-16 DIAGNOSIS — C259 Malignant neoplasm of pancreas, unspecified: Secondary | ICD-10-CM | POA: Diagnosis not present

## 2017-05-16 DIAGNOSIS — K8689 Other specified diseases of pancreas: Secondary | ICD-10-CM | POA: Diagnosis not present

## 2017-05-16 NOTE — Telephone Encounter (Signed)
FMLA papers completed & faxed, pt informed and copy mailed to pt

## 2017-05-16 NOTE — Telephone Encounter (Signed)
Pt came by to pick up FMLA papers, said [facility] needs today,  Please fax to [employer] when complete and call pt when done

## 2017-05-16 NOTE — Telephone Encounter (Signed)
done

## 2017-05-16 NOTE — Progress Notes (Signed)
I was unable to reach patient by phone.  I left  A message on voice mail.  I instructed the patient to arrive at [place] entrance at 10:30AM , nothing to eat or drink after midnight.   I instructed the patient to not asked patient to not wear any lotions, powders, cologne, jewelry, piercing, make-up or nail polish.  I asked the patient to call [phone], in the am if there were any questions or problems.

## 2017-05-17 ENCOUNTER — Encounter (HOSPITAL_COMMUNITY): Admission: RE | Payer: Self-pay | Source: Ambulatory Visit

## 2017-05-17 ENCOUNTER — Ambulatory Visit (HOSPITAL_COMMUNITY): Admission: RE | Admit: 2017-05-17 | Payer: 59 | Source: Ambulatory Visit | Admitting: Gastroenterology

## 2017-05-17 SURGERY — ERCP, WITH INTERVENTION IF INDICATED
Anesthesia: General

## 2017-05-19 ENCOUNTER — Encounter: Payer: 59 | Admitting: Medical

## 2017-05-24 MED FILL — oxyCODONE HCL 5 MG TABS: 5 | 2 days supply | Qty: 20 | Fill #0

## 2017-05-24 MED FILL — URSODIOL 300 MG CAPSULE: 300 | 30 days supply | Qty: 60 | Fill #0

## 2017-05-31 DIAGNOSIS — C259 Malignant neoplasm of pancreas, unspecified: Secondary | ICD-10-CM | POA: Diagnosis not present

## 2017-05-31 DIAGNOSIS — C787 Secondary malignant neoplasm of liver and intrahepatic bile duct: Secondary | ICD-10-CM | POA: Diagnosis not present

## 2017-05-31 DIAGNOSIS — C250 Malignant neoplasm of head of pancreas: Secondary | ICD-10-CM | POA: Diagnosis not present

## 2017-05-31 MED FILL — oxyCODONE HCL 5 MG TABS: 5 | 22 days supply | Qty: 90 | Fill #0

## 2017-05-31 MED FILL — POLYETHYLENE GLYCOL 3350 PO: 35 days supply | Qty: 527 | Fill #0

## 2017-05-31 MED FILL — PROCHLORPERAZINE 10 MG TAB: 10 | 30 days supply | Qty: 120 | Fill #0

## 2017-05-31 MED FILL — ONDANSETRON HCL 8 MG TAB: 8 | 30 days supply | Qty: 90 | Fill #0

## 2017-06-09 DIAGNOSIS — Z452 Encounter for adjustment and management of vascular access device: Secondary | ICD-10-CM | POA: Diagnosis not present

## 2017-06-09 DIAGNOSIS — C259 Malignant neoplasm of pancreas, unspecified: Secondary | ICD-10-CM | POA: Diagnosis not present

## 2017-06-13 DIAGNOSIS — K7689 Other specified diseases of liver: Secondary | ICD-10-CM | POA: Diagnosis not present

## 2017-06-13 DIAGNOSIS — C259 Malignant neoplasm of pancreas, unspecified: Secondary | ICD-10-CM | POA: Diagnosis not present

## 2017-06-14 DIAGNOSIS — C787 Secondary malignant neoplasm of liver and intrahepatic bile duct: Secondary | ICD-10-CM | POA: Diagnosis not present

## 2017-06-14 DIAGNOSIS — C259 Malignant neoplasm of pancreas, unspecified: Secondary | ICD-10-CM | POA: Diagnosis not present

## 2017-06-14 DIAGNOSIS — C250 Malignant neoplasm of head of pancreas: Secondary | ICD-10-CM | POA: Diagnosis not present

## 2017-06-14 MED FILL — LIDOCAINE-PRILOCAINE CREAM: 2.5-2.5 | 30 days supply | Qty: 30 | Fill #0

## 2017-06-14 MED FILL — ESOMEPRAZOLE MAG DR 40 MG C: 40 | 90 days supply | Qty: 90 | Fill #0

## 2017-06-15 DIAGNOSIS — Z5111 Encounter for antineoplastic chemotherapy: Secondary | ICD-10-CM | POA: Diagnosis not present

## 2017-06-15 DIAGNOSIS — C259 Malignant neoplasm of pancreas, unspecified: Secondary | ICD-10-CM | POA: Diagnosis not present

## 2017-06-20 ENCOUNTER — Encounter: Payer: Self-pay | Admitting: *Deleted

## 2017-06-20 ENCOUNTER — Other Ambulatory Visit: Payer: Self-pay | Admitting: *Deleted

## 2017-06-20 MED FILL — URSODIOL 300 MG CAPSULE: 300 | 30 days supply | Qty: 60 | Fill #1

## 2017-06-20 NOTE — Patient Outreach (Signed)
Megan Jordan) Care Management  06/20/2017  Megan Jordan 18-Aug-1956 [id number]   Subjective: Telephone call to patient's home / mobile number, spoke with patient, and HIPAA verified.  Discussed [facility] Care Management UMR Transition of care follow up, patient voiced understanding, and is in agreement to follow up.   Patient states she is doing fine, was just diagnosed with cancer, is concentrating on the new diagnosis, and had follow up appointment with oncologist on 06/15/17.   States he blood pressure is running around 97/60's and is taking medications as prescribed.   States she is accessing the following [facility] benefits: outpatient pharmacy, will look into the oncology benefits through [facility], and [facility].  RNCM advised patient will also discuss her case with [facility] RNCM on next [facility] CM call, patient voices understanding, and is in agreement.  States she is feeling tired, does not want to talk anymore right now, will call this RNCM if  further assistance needed. Patient states she does not have any education material, transition of care, care coordination, disease management, disease monitoring, transportation, community resource, or pharmacy needs at this time.  States she is very appreciative of the follow up and is in agreement to receive [place] Management information.    Objective: Per chart review and 06/16/17 UMR CM Call update, patient hospitalized  05/16/2017 - 05/24/2017 at [facility] for new diagnosis of  pancreatic head adenocarcinoma. Status post  laparoscopic liver biopsy confirming metastatic disease. Patient was a direct admit for ERCP to evaluate pancreatic mass, biliary dilatation, jaundice. ERCP aborted, biliary stent placed. Current status: Stable, home. Will have port placed in near future, likely chemo to start once healed.  UMR CM outreach, with no response from patient to date.    Assessment:  Received UMR CM Call / [facility] Consult Referral for transition  of care referral on 06/17/17.  Transition of care follow up completed, no care management needs, and will proceed with case closure.     Plan: RNCM will send patient successful outreach letter, [facility] pamphlet, and magnet. RNCM will send case closure due to follow up completed / no care management needs request to [name] [place] [facility].   Tannis Burstein H. Annia Friendly, BSN, [place] Management [facility] Telephonic CM Phone: [phone] Fax: [phone]

## 2017-06-27 DIAGNOSIS — Z5111 Encounter for antineoplastic chemotherapy: Secondary | ICD-10-CM | POA: Diagnosis not present

## 2017-06-27 DIAGNOSIS — C259 Malignant neoplasm of pancreas, unspecified: Secondary | ICD-10-CM | POA: Diagnosis not present

## 2017-06-29 DIAGNOSIS — C259 Malignant neoplasm of pancreas, unspecified: Secondary | ICD-10-CM | POA: Diagnosis not present

## 2017-06-29 DIAGNOSIS — Z5111 Encounter for antineoplastic chemotherapy: Secondary | ICD-10-CM | POA: Diagnosis not present

## 2017-07-12 DIAGNOSIS — C259 Malignant neoplasm of pancreas, unspecified: Secondary | ICD-10-CM | POA: Diagnosis not present

## 2017-07-12 DIAGNOSIS — Z5111 Encounter for antineoplastic chemotherapy: Secondary | ICD-10-CM | POA: Diagnosis not present

## 2017-07-13 DIAGNOSIS — Z5111 Encounter for antineoplastic chemotherapy: Secondary | ICD-10-CM | POA: Diagnosis not present

## 2017-07-13 DIAGNOSIS — C259 Malignant neoplasm of pancreas, unspecified: Secondary | ICD-10-CM | POA: Diagnosis not present

## 2017-07-21 MED FILL — URSODIOL 300 MG CAPSULE: 300 | 30 days supply | Qty: 60 | Fill #2

## 2017-07-26 DIAGNOSIS — C259 Malignant neoplasm of pancreas, unspecified: Secondary | ICD-10-CM | POA: Diagnosis not present

## 2017-07-26 DIAGNOSIS — Z5111 Encounter for antineoplastic chemotherapy: Secondary | ICD-10-CM | POA: Diagnosis not present

## 2017-07-27 DIAGNOSIS — C259 Malignant neoplasm of pancreas, unspecified: Secondary | ICD-10-CM | POA: Diagnosis not present

## 2017-07-27 DIAGNOSIS — Z5111 Encounter for antineoplastic chemotherapy: Secondary | ICD-10-CM | POA: Diagnosis not present

## 2017-07-27 MED FILL — oxyCODONE HCL 5 MG TABS: 5 | 22 days supply | Qty: 90 | Fill #0

## 2017-08-02 MED FILL — POLYETHYLENE GLYCOL 3350 PO: 35 days supply | Qty: 527 | Fill #1

## 2017-08-09 DIAGNOSIS — C259 Malignant neoplasm of pancreas, unspecified: Secondary | ICD-10-CM | POA: Diagnosis not present

## 2017-08-09 DIAGNOSIS — Z5111 Encounter for antineoplastic chemotherapy: Secondary | ICD-10-CM | POA: Diagnosis not present

## 2017-08-10 DIAGNOSIS — C259 Malignant neoplasm of pancreas, unspecified: Secondary | ICD-10-CM | POA: Diagnosis not present

## 2017-08-10 DIAGNOSIS — Z5111 Encounter for antineoplastic chemotherapy: Secondary | ICD-10-CM | POA: Diagnosis not present

## 2017-08-23 DIAGNOSIS — C259 Malignant neoplasm of pancreas, unspecified: Secondary | ICD-10-CM | POA: Diagnosis not present

## 2017-08-23 DIAGNOSIS — D509 Iron deficiency anemia, unspecified: Secondary | ICD-10-CM | POA: Diagnosis not present

## 2017-08-23 MED FILL — LISINOPRIL-HCTZ 20-12.5 MG: 20-12.5 | 90 days supply | Qty: 90 | Fill #0

## 2017-08-24 DIAGNOSIS — Z5111 Encounter for antineoplastic chemotherapy: Secondary | ICD-10-CM | POA: Diagnosis not present

## 2017-08-24 DIAGNOSIS — C259 Malignant neoplasm of pancreas, unspecified: Secondary | ICD-10-CM | POA: Diagnosis not present

## 2017-09-06 DIAGNOSIS — D649 Anemia, unspecified: Secondary | ICD-10-CM | POA: Diagnosis not present

## 2017-09-06 DIAGNOSIS — R109 Unspecified abdominal pain: Secondary | ICD-10-CM | POA: Diagnosis not present

## 2017-09-06 DIAGNOSIS — C259 Malignant neoplasm of pancreas, unspecified: Secondary | ICD-10-CM | POA: Diagnosis not present

## 2017-09-06 DIAGNOSIS — R5383 Other fatigue: Secondary | ICD-10-CM | POA: Diagnosis not present

## 2017-09-07 DIAGNOSIS — Z5111 Encounter for antineoplastic chemotherapy: Secondary | ICD-10-CM | POA: Diagnosis not present

## 2017-09-07 DIAGNOSIS — C259 Malignant neoplasm of pancreas, unspecified: Secondary | ICD-10-CM | POA: Diagnosis not present

## 2017-09-07 DIAGNOSIS — E860 Dehydration: Secondary | ICD-10-CM | POA: Diagnosis not present

## 2017-09-07 DIAGNOSIS — R110 Nausea: Secondary | ICD-10-CM | POA: Diagnosis not present

## 2017-09-15 DIAGNOSIS — R110 Nausea: Secondary | ICD-10-CM | POA: Diagnosis not present

## 2017-09-15 DIAGNOSIS — C259 Malignant neoplasm of pancreas, unspecified: Secondary | ICD-10-CM | POA: Diagnosis not present

## 2017-09-15 DIAGNOSIS — E860 Dehydration: Secondary | ICD-10-CM | POA: Diagnosis not present

## 2017-09-15 DIAGNOSIS — Z5111 Encounter for antineoplastic chemotherapy: Secondary | ICD-10-CM | POA: Diagnosis not present

## 2017-09-16 MED FILL — MORPHINE SULF 10 MG/5 ML SO: 10 | 6 days supply | Qty: 194 | Fill #0

## 2017-09-16 MED FILL — ONDANSETRON ODT 8 MG TABLET: 8 | 30 days supply | Qty: 90 | Fill #0

## 2017-09-20 DIAGNOSIS — Z515 Encounter for palliative care: Secondary | ICD-10-CM | POA: Diagnosis not present

## 2017-09-20 DIAGNOSIS — T451X5A Adverse effect of antineoplastic and immunosuppressive drugs, initial encounter: Secondary | ICD-10-CM | POA: Diagnosis not present

## 2017-09-20 DIAGNOSIS — R109 Unspecified abdominal pain: Secondary | ICD-10-CM | POA: Diagnosis not present

## 2017-09-20 DIAGNOSIS — Z9221 Personal history of antineoplastic chemotherapy: Secondary | ICD-10-CM | POA: Diagnosis not present

## 2017-09-20 DIAGNOSIS — R1110 Vomiting, unspecified: Secondary | ICD-10-CM | POA: Diagnosis not present

## 2017-09-20 DIAGNOSIS — N179 Acute kidney failure, unspecified: Secondary | ICD-10-CM | POA: Diagnosis not present

## 2017-09-20 DIAGNOSIS — R112 Nausea with vomiting, unspecified: Secondary | ICD-10-CM | POA: Diagnosis not present

## 2017-09-20 DIAGNOSIS — Z7189 Other specified counseling: Secondary | ICD-10-CM | POA: Diagnosis not present

## 2017-09-20 DIAGNOSIS — C259 Malignant neoplasm of pancreas, unspecified: Secondary | ICD-10-CM | POA: Diagnosis not present

## 2017-09-20 DIAGNOSIS — Z8507 Personal history of malignant neoplasm of pancreas: Secondary | ICD-10-CM | POA: Diagnosis not present

## 2017-09-20 DIAGNOSIS — E873 Alkalosis: Secondary | ICD-10-CM | POA: Diagnosis not present

## 2017-09-20 DIAGNOSIS — K831 Obstruction of bile duct: Secondary | ICD-10-CM | POA: Diagnosis not present

## 2017-09-20 DIAGNOSIS — E876 Hypokalemia: Secondary | ICD-10-CM | POA: Diagnosis not present

## 2017-09-20 DIAGNOSIS — C787 Secondary malignant neoplasm of liver and intrahepatic bile duct: Secondary | ICD-10-CM | POA: Diagnosis not present

## 2017-09-20 DIAGNOSIS — E860 Dehydration: Secondary | ICD-10-CM | POA: Diagnosis not present

## 2017-09-20 DIAGNOSIS — I10 Essential (primary) hypertension: Secondary | ICD-10-CM | POA: Diagnosis not present

## 2017-09-20 DIAGNOSIS — I959 Hypotension, unspecified: Secondary | ICD-10-CM | POA: Diagnosis not present

## 2017-09-21 DIAGNOSIS — Z22322 Carrier or suspected carrier of Methicillin resistant Staphylococcus aureus: Secondary | ICD-10-CM

## 2017-09-21 HISTORY — DX: Carrier or suspected carrier of methicillin resistant Staphylococcus aureus: Z22.322

## 2017-09-23 MED FILL — DRONABINOL 2.5 MG CAPSULE: 2.5 | 30 days supply | Qty: 60 | Fill #0

## 2017-09-27 DIAGNOSIS — C259 Malignant neoplasm of pancreas, unspecified: Secondary | ICD-10-CM | POA: Diagnosis not present

## 2017-09-27 DIAGNOSIS — K3184 Gastroparesis: Secondary | ICD-10-CM | POA: Diagnosis not present

## 2017-09-27 DIAGNOSIS — E860 Dehydration: Secondary | ICD-10-CM | POA: Diagnosis not present

## 2017-09-27 DIAGNOSIS — R110 Nausea: Secondary | ICD-10-CM | POA: Diagnosis not present

## 2017-09-27 DIAGNOSIS — Z5111 Encounter for antineoplastic chemotherapy: Secondary | ICD-10-CM | POA: Diagnosis not present

## 2017-09-27 MED FILL — METOCLOPRAMIDE 5 MG TABLET: 5 | 14 days supply | Qty: 56 | Fill #0

## 2017-09-27 MED FILL — DEXAMETHASONE 4 MG TABLET: 4 | 12 days supply | Qty: 24 | Fill #0

## 2017-09-28 ENCOUNTER — Other Ambulatory Visit: Payer: Self-pay | Admitting: *Deleted

## 2017-09-28 MED FILL — POTASSIUM CL ER 20 MEQ TAB: 20 | 7 days supply | Qty: 14 | Fill #0

## 2017-09-28 NOTE — Patient Outreach (Signed)
[facility]) Care Management  09/28/2017  Megan Jordan [birth year] [id number]   Subjective: Telephone call to patient's home number, no answer, message states mailbox is full, and unable to leave a message.  Objective: Per KPN (Knowledge Performance Now, point of care tool) and chart review, patient hospitalized 09/20/17 - (discharge unknown) at [place],.   Per 06/16/17 UMR CM Call update, patient hospitalized  05/16/2017 - 05/24/2017 at [facility] for new diagnosis of  pancreatic head adenocarcinoma. Status post  laparoscopic liver biopsy confirming metastatic disease. Patient was a direct admit for ERCP to evaluate pancreatic mass, biliary dilatation, jaundice. ERCP aborted, biliary stent placed. Current status: Stable, home. Will have port placed in near future, likely chemo to start once healed.  [facility] Care Management Transition of care follow up completed on 06/20/17.     Assessment:  Received UMR Transition of care referral on 09/27/17.   Transition of care follow up pending patient contact.     Plan: RNCM will call patient for 2nd telephone outreach attempt, transition of care follow up, within 10 business days if no return call.    Megan Jordan H. Annia Friendly, BSN, [place] Management [facility] Telephonic CM Phone: [phone] Fax: [phone]

## 2017-09-29 ENCOUNTER — Other Ambulatory Visit: Payer: 59 | Admitting: *Deleted

## 2017-09-29 MED FILL — KLOR-CON 20 MEQ PKG: 20 | 7 days supply | Qty: 14 | Fill #0

## 2017-09-29 NOTE — Patient Outreach (Addendum)
[facility]) Care Management  09/29/2017  KELITA WALLIS 18-Dec-1955 [id number]   Subjective: Telephone call to patient's home number, spoke with patient, and patient stated this is Megan Jordan.   Ms. Simao stated she is not feeling very well this morning, very nauseated, will talk to caller later, did not want to talk at this time, declined to verify HIPAA, declined caller assistance, and call disconnected.     Objective: Per KPN (Knowledge Performance Now, point of care tool) and chart review, patient hospitalized 09/20/17 - (discharge unknown) at [place],.   Per 06/16/17 UMR CM Call update, patient hospitalized 05/16/2017 - 05/24/2017 at [facility] new diagnosis of pancreatic head adenocarcinoma. Status post laparoscopic liver biopsy confirming metastatic disease. Patient was adirect admit for ERCP to evaluate pancreatic mass, biliary dilatation, jaundice. ERCP aborted, biliary stent placed. Current status: Stable, home. Will have port placed in near future, likely chemo to start once healed. [facility] Care Management Transition of care follow up completed on 06/20/17.     Assessment: Received UMR Transition of care referral on 09/27/17.   Transition of care follow up pending patient contact.     Plan: RNCM will call patient for 3rd telephone outreach attempt, transition of care follow up, within 10 business days if no return call.    Kimiye Strathman H. Annia Friendly, BSN, [place] Management [facility] Telephonic CM Phone: [phone] Fax: [phone]

## 2017-09-30 ENCOUNTER — Encounter: Payer: Self-pay | Admitting: *Deleted

## 2017-09-30 ENCOUNTER — Other Ambulatory Visit: Payer: Self-pay | Admitting: *Deleted

## 2017-09-30 DIAGNOSIS — N179 Acute kidney failure, unspecified: Secondary | ICD-10-CM | POA: Diagnosis not present

## 2017-09-30 DIAGNOSIS — E860 Dehydration: Secondary | ICD-10-CM | POA: Diagnosis not present

## 2017-09-30 DIAGNOSIS — R1110 Vomiting, unspecified: Secondary | ICD-10-CM | POA: Diagnosis not present

## 2017-09-30 DIAGNOSIS — E873 Alkalosis: Secondary | ICD-10-CM | POA: Diagnosis not present

## 2017-09-30 DIAGNOSIS — E876 Hypokalemia: Secondary | ICD-10-CM | POA: Diagnosis not present

## 2017-09-30 DIAGNOSIS — R112 Nausea with vomiting, unspecified: Secondary | ICD-10-CM | POA: Diagnosis not present

## 2017-09-30 DIAGNOSIS — C787 Secondary malignant neoplasm of liver and intrahepatic bile duct: Secondary | ICD-10-CM | POA: Diagnosis not present

## 2017-09-30 NOTE — Patient Outreach (Signed)
[facility]) Care Management  09/30/2017  Megan Jordan Nov 29, 1956 [id number]   Subjective: Telephone call to patient's home number, no answer, message states mailbox is full, and unable to leave a message.  Objective: Per KPN (Knowledge Performance Now, point of care tool) and chart review, patient hospitalized 09/20/17 - (discharge unknown) at [place],.   Per 06/16/17 UMR CM Call update, patient hospitalized 05/16/2017 - 05/24/2017 at [facility] new diagnosis of pancreatic head adenocarcinoma. Status post laparoscopic liver biopsy confirming metastatic disease. Patient was adirect admit for ERCP to evaluate pancreatic mass, biliary dilatation, jaundice. ERCP aborted, biliary stent placed. Current status: Stable, home. Will have port placed in near future, likely chemo to start once healed. [facility] Care Management Transition of care follow up completed on 06/20/17.     Assessment: Received UMR Transition of care referral on 09/27/17.   Transition of care follow up pending patient contact.     Plan:RNCM will send unsuccessful outreach  letter, [facility] pamphlet, and proceed with case closure, within 10 business days if no return call.     Delilah Mulgrew H. Annia Friendly, BSN, [place] Management [facility] Telephonic CM Phone: [phone] Fax: [phone]

## 2017-10-01 DIAGNOSIS — E860 Dehydration: Secondary | ICD-10-CM | POA: Diagnosis not present

## 2017-10-01 DIAGNOSIS — C784 Secondary malignant neoplasm of small intestine: Secondary | ICD-10-CM | POA: Diagnosis not present

## 2017-10-01 DIAGNOSIS — R1110 Vomiting, unspecified: Secondary | ICD-10-CM | POA: Diagnosis not present

## 2017-10-01 DIAGNOSIS — K769 Liver disease, unspecified: Secondary | ICD-10-CM | POA: Diagnosis not present

## 2017-10-01 DIAGNOSIS — N179 Acute kidney failure, unspecified: Secondary | ICD-10-CM | POA: Diagnosis not present

## 2017-10-01 DIAGNOSIS — C787 Secondary malignant neoplasm of liver and intrahepatic bile duct: Secondary | ICD-10-CM | POA: Diagnosis not present

## 2017-10-01 DIAGNOSIS — E876 Hypokalemia: Secondary | ICD-10-CM | POA: Diagnosis not present

## 2017-10-01 DIAGNOSIS — K831 Obstruction of bile duct: Secondary | ICD-10-CM | POA: Diagnosis not present

## 2017-10-01 DIAGNOSIS — K311 Adult hypertrophic pyloric stenosis: Secondary | ICD-10-CM | POA: Diagnosis not present

## 2017-10-01 DIAGNOSIS — R109 Unspecified abdominal pain: Secondary | ICD-10-CM | POA: Diagnosis not present

## 2017-10-01 DIAGNOSIS — C250 Malignant neoplasm of head of pancreas: Secondary | ICD-10-CM | POA: Diagnosis not present

## 2017-10-01 DIAGNOSIS — Z0389 Encounter for observation for other suspected diseases and conditions ruled out: Secondary | ICD-10-CM | POA: Diagnosis not present

## 2017-10-01 DIAGNOSIS — Z4682 Encounter for fitting and adjustment of non-vascular catheter: Secondary | ICD-10-CM | POA: Diagnosis not present

## 2017-10-01 DIAGNOSIS — E873 Alkalosis: Secondary | ICD-10-CM | POA: Diagnosis not present

## 2017-10-01 DIAGNOSIS — C259 Malignant neoplasm of pancreas, unspecified: Secondary | ICD-10-CM | POA: Diagnosis not present

## 2017-10-01 DIAGNOSIS — R112 Nausea with vomiting, unspecified: Secondary | ICD-10-CM | POA: Diagnosis not present

## 2017-10-01 DIAGNOSIS — K315 Obstruction of duodenum: Secondary | ICD-10-CM | POA: Diagnosis not present

## 2017-10-01 DIAGNOSIS — R509 Fever, unspecified: Secondary | ICD-10-CM | POA: Diagnosis not present

## 2017-10-05 ENCOUNTER — Encounter: Payer: Self-pay | Admitting: Radiation Oncology

## 2017-10-06 MED FILL — fentaNYL 12 MCG/HR PT72: 12 | 30 days supply | Qty: 10 | Fill #0

## 2017-10-11 MED FILL — HYDROmorphone HCL 2 MG TABS: 2 | 7 days supply | Qty: 30 | Fill #0

## 2017-10-12 ENCOUNTER — Encounter: Payer: Self-pay | Admitting: Radiation Oncology

## 2017-10-12 NOTE — Progress Notes (Signed)
  Oncology Nurse Navigator Documentation  Navigator Location: [facility] (10/12/17 1012) Referral date to RadOnc/MedOnc: (patietn not s) (10/12/17 1012) )Navigator Encounter Type: Telephone (10/12/17 1012) Telephone: Outgoing Call (10/12/17 1012)                           Interventions: Education (10/12/17 1012)    Pt was discussed at GI Tumor board this morning. Dr. Benay Spice asked me to touch base with patient to introduce myself and my role as GI Navigator. Patient was guarded during our brief conversation and was not open to me sharing about [facility] and our treatment team. Patient stated that she is not sure where she would like to be treated. I gave my contact information to patient if she has further questions and explained where patient would need to go for her appointment with Dr. Lisbeth Renshaw on 10/13/17.        Acuity: Level 1 (10/12/17 1012) Acuity Level 1: Minimal follow up required;Initial guidance, education and coordination as needed (10/12/17 1012)       Time Spent with Patient: 15 (10/12/17 1012)

## 2017-10-12 NOTE — Progress Notes (Signed)
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 Elie Confer  Last Friday put stent metal common bile duct  Past/Anticipated interventions by medical oncology, if any: [facility] [place] Dr. Doylene Canard, MD ,aborted whipple  Procedure  S/p palliative gem/nab/paclitaxel x 4 cycles,    Weight changes, if any: loss, 23 lb loss pat 5 months  Bowel/Bladder complaints, if any: constipation, last bm 2 days ago, normal bladder  Nausea / Vomiting, if any: ongoing , gastric outlet obstruction, metal common bile duct stent ,   Pain issues, if any:  Abdominal  Pain,  Any blood per rectum:   No  SAFETY ISSUES: weakness, fatigue,   Prior radiation? NO  Pacemaker/ICD? NO  Possible current pregnancy? NO , tubal ligation and oophorectomy  Is the patient on methotrexate? No  Current Complaints/Details:Pyschiatric nurse,non smoker, no alcohol or illicit drug use, no family hx cancer,Gerd,C-section,Allergies:Celebrex,revacic,Zocor,Zofran BP (!) 143/83   Pulse 83   Temp 98.6 F (37 C) (Oral)   Resp 20   Ht _0  (1.549 m)   Wt 152 lb 6.4 oz (69.1 kg)   BMI 28.80 kg/m   Wt Readings from Last 3 Encounters:  10/13/17 152 lb 6.4 oz (69.1 kg)  05/12/17 175 lb (79.4 kg)  05/06/17 178 lb (80.7 kg)

## 2017-10-13 ENCOUNTER — Ambulatory Visit
Admission: RE | Admit: 2017-10-13 | Discharge: 2017-10-13 | Disposition: A | Discharge status: Home / Self Care | Payer: 59 | Source: Ambulatory Visit | Attending: Radiation Oncology | Admitting: Radiation Oncology

## 2017-10-13 ENCOUNTER — Encounter: Payer: Self-pay | Admitting: Radiation Oncology

## 2017-10-13 VITALS — BP 143/83 | HR 83 | Temp 98.6°F | Resp 20 | Ht 61.0 in | Wt 152.4 lb

## 2017-10-13 DIAGNOSIS — C250 Malignant neoplasm of head of pancreas: Secondary | ICD-10-CM | POA: Insufficient documentation

## 2017-10-13 DIAGNOSIS — Z9221 Personal history of antineoplastic chemotherapy: Secondary | ICD-10-CM | POA: Diagnosis not present

## 2017-10-13 DIAGNOSIS — Z510 Encounter for antineoplastic radiation therapy: Secondary | ICD-10-CM | POA: Insufficient documentation

## 2017-10-13 DIAGNOSIS — C787 Secondary malignant neoplasm of liver and intrahepatic bile duct: Secondary | ICD-10-CM | POA: Diagnosis not present

## 2017-10-13 DIAGNOSIS — R110 Nausea: Secondary | ICD-10-CM | POA: Diagnosis not present

## 2017-10-13 DIAGNOSIS — Z9689 Presence of other specified functional implants: Secondary | ICD-10-CM | POA: Insufficient documentation

## 2017-10-13 DIAGNOSIS — K56609 Unspecified intestinal obstruction, unspecified as to partial versus complete obstruction: Secondary | ICD-10-CM | POA: Diagnosis not present

## 2017-10-13 HISTORY — DX: Malignant (primary) neoplasm, unspecified: C80.1

## 2017-10-13 HISTORY — DX: Iron deficiency anemia, unspecified: D50.9

## 2017-10-13 HISTORY — DX: Carrier or suspected carrier of methicillin resistant Staphylococcus aureus: Z22.322

## 2017-10-13 NOTE — Progress Notes (Signed)
Please see the Nurse Progress Note in the MD Initial Consult Encounter for this patient. 

## 2017-10-13 NOTE — Progress Notes (Signed)
Radiation Oncology         [phone] ________________________________  Name: Megan Jordan        MRN: [id number]  Date of Service: 10/13/2017 DOB: [birth year]  CC:Tysinger, Camelia Eng, PA-C  Prabhu, Legrand Como, MD     REFERRING PHYSICIAN: Beryle Flock, MD   DIAGNOSIS: The encounter diagnosis was Malignant neoplasm of head of pancreas [facility]).   HISTORY OF PRESENT ILLNESS: Megan Jordan is a 61 y.o. female seen at the request of Dr. Clint Bolder for a history of Stage IV pancreatic cancer.  The patient originally presented with transaminitis and jaundice in the summer 2018.  She underwent a CT scan of the abdomen and pelvis on 05/09/2017 which revealed a 1.8 x 2.5 x 2.5 cm hypoenhancing ill-defined lesion in the head of the pancreas without dilatation of the main pancreatic duct, and no evidence of vessel involvement or adenopathy was present.  No focal abnormalities were seen in the liver.  She underwent attempted ERCP however opacification of the common bile duct could not be accomplished due to positioning.  She ultimately underwent EUS as well on 05/12/2017, and fine-needle aspirate of the pancreatic head mass revealed adenocarcinoma.  Dr. Paulita Fujita who performed this recommended that she meet with oncology.  She ultimately elected to pursue care in [place] at [place] [facility].  She was taken to the operating room on 05/19/2017 for attempted Whipple however there was biopsy confirmed adenocarcinoma within the segment 3 surface of the liver.  She then went on to proceed with palliative Gemzar Abraxane, and completed 4 cycles of this.  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  She underwent NG tube placement for decompression, and duodenal stent placement on 10/06/2017.  She met with radiation oncology on 10/05/2017, and the recommendations were for her to proceed with palliative  radiotherapy with 30 Gy in 10 fractions.  Because of living in [place], she comes today to discuss options of care closer to home.   PREVIOUS RADIATION THERAPY: No   PAST MEDICAL HISTORY:  Past Medical History:  Diagnosis Date  . Allergy   . Cancer ([place])    liver mets  . GERD (gastroesophageal reflux disease)   . History of mammogram    Dr. Matthew Saras  . Hyperlipidemia   . Hypertension 2005  . IDA (iron deficiency anemia)   . MRSA (methicillin resistant staph aureus) culture positive 09/21/2017   nares confirmed  . Obesity   . Routine gynecological examination    Dr. Matthew Saras  . Vitamin D deficiency   . Wears glasses   . Wears partial dentures        PAST SURGICAL HISTORY: Past Surgical History:  Procedure Laterality Date  . COLONOSCOPY  01/2012   Dr. Paulita Fujita with [facility]  . OOPHORECTOMY     right  . TUBAL LIGATION  1990'S     FAMILY HISTORY:  Family History  Problem Relation Age of Onset  . Cancer Mother 71       colon  . Hypertension Mother   . Other Father        unknown  . Other Sister        died of MVA  . Aneurysm Sister   . Diabetes Neg Hx   . Heart disease Neg Hx   . Stroke Neg Hx      SOCIAL HISTORY:  reports that  has  never smoked. she has never used smokeless tobacco. She reports that she does not drink alcohol or use drugs.   ALLERGIES: Zocor [simvastatin]; Zofran [ondansetron hcl]; Celebrex [celecoxib]; and Prevacid [lansoprazole]   MEDICATIONS:  Current Outpatient Medications  Medication Sig Dispense Refill  . ASPIR-LOW 81 MG EC tablet TAKE 1 TABLET BY MOUTH DAILY. 90 tablet 3  . Biotin 5000 MCG TABS Take 5,000 mcg by mouth daily.    Marland Kitchen dronabinol (MARINOL) 2.5 MG capsule Take 2.5 mg 2 (two) times daily before a meal by mouth.    . fentaNYL (DURAGESIC - DOSED MCG/HR) 12 MCG/HR Place 12.5 mcg every 3 (three) days onto the skin. Next due to be  Changed 10/15/17    . HYDROmorphone (DILAUDID) 2 MG tablet Take every 6 (six) hours as needed  by mouth for severe pain.    . Hypromellose (ARTIFICIAL TEARS OP) Apply 1 drop to eye daily as needed (dry eyes).    Marland Kitchen loratadine (CLARITIN) 10 MG tablet Take 10 mg by mouth daily as needed for allergies.    . Melatonin 10 MG SUBL Place 10 mg under the tongue at bedtime.    . metoCLOPramide (REGLAN) 5 MG tablet Take 5 mg 4 (four) times daily by mouth.    . prochlorperazine (COMPAZINE) 10 MG tablet Take 10 mg every 8 (eight) hours as needed by mouth for nausea or vomiting.    Marland Kitchen CALCIUM-VITAMIN D PO Take 1 tablet by mouth daily.    . Cyanocobalamin (B-12) 2500 MCG SUBL Place 2,500 mcg under the tongue daily.    Marland Kitchen esomeprazole (NEXIUM) 40 MG capsule Take 1 capsule (40 mg total) by mouth daily. (Patient not taking: Reported on 10/13/2017) 30 capsule 0  . lisinopril-hydrochlorothiazide (PRINZIDE,ZESTORETIC) 20-12.5 MG tablet Take 1 tablet by mouth daily. (Patient not taking: Reported on 10/13/2017) 90 tablet 3  . Multiple Vitamins-Minerals (MULTIVITAMIN WITH MINERALS) tablet Take 1 tablet by mouth daily.    . Omega-3 Fatty Acids (FISH OIL) 1000 MG CAPS Take 1,000 mg by mouth daily.    . ursodiol (ACTIGALL) 300 MG capsule Take 300 mg by mouth 2 (two) times daily.    . vitamin C (ASCORBIC ACID) 500 MG tablet Take 500 mg by mouth daily.     No current facility-administered medications for this encounter.      REVIEW OF SYSTEMS: On review of systems, the patient reports that she is doing well overall since the placement of her stent. She notes occasional fullness and has early satiety and difficulty with bloating if she feels like she eats too much. She has had several episodes of nausea but uses scheduled compazine and merinol to help. She denies any chest pain, shortness of breath, cough, fevers, chills, night sweats. She has lost about 10 pounds in the last 3 months. She denies any bowel or bladder disturbances, and denies abdominal pain, nausea or vomiting. She  denies any new musculoskeletal or joint  aches or pains. A complete review of systems is obtained and is otherwise negative.     PHYSICAL EXAM:  Wt Readings from Last 3 Encounters:  10/13/17 152 lb 6.4 oz (69.1 kg)  05/12/17 175 lb (79.4 kg)  05/06/17 178 lb (80.7 kg)   Temp Readings from Last 3 Encounters:  10/13/17 98.6 F (37 C) (Oral)  05/12/17 97.6 F (36.4 C) (Oral)  05/06/17 98.6 F (37 C)   BP Readings from Last 3 Encounters:  10/13/17 (!) 143/83  05/12/17 (!) 151/81  05/06/17 132/70   Pulse  Readings from Last 3 Encounters:  10/13/17 83  05/12/17 80  05/06/17 84   Pain Assessment Pain Score: 4  Pain Loc: Abdomen/10  In general this is a well appearing African American female in no acute distress. She is alert and oriented x4 and appropriate throughout the examination. HEENT reveals that the patient is normocephalic, atraumatic. EOMs are intact. PERRLA. Skin is intact without any evidence of gross lesions. Cardiopulmonary assessment is negative for acute distress and she exhibits normal effort.     ECOG = 1  0 - Asymptomatic (Fully active, able to carry on all predisease activities without restriction)  1 - Symptomatic but completely ambulatory (Restricted in physically strenuous activity but ambulatory and able to carry out work of a light or sedentary nature. For example, light housework, office work)  2 - Symptomatic, <50% in bed during the day (Ambulatory and capable of all self care but unable to carry out any work activities. Up and about more than 50% of waking hours)  3 - Symptomatic, >50% in bed, but not bedbound (Capable of only limited self-care, confined to bed or chair 50% or more of waking hours)  4 - Bedbound (Completely disabled. Cannot carry on any self-care. Totally confined to bed or chair)  5 - Death   Eustace Pen MM, Creech RH, Tormey DC, et al. [phone]). "Toxicity and response criteria of the [facility]". [place] Oncol. 5 (6): 649-55    LABORATORY DATA:    Lab Results  Component Value Date   WBC 5.6 05/06/2017   HGB 10.6 (L) 05/06/2017   HCT 32.6 (L) 05/06/2017   MCV 85.6 05/06/2017   PLT 282 05/06/2017   Lab Results  Component Value Date   NA 134 (L) 05/06/2017   K 4.3 05/06/2017   CL 101 05/06/2017   CO2 22 05/06/2017   Lab Results  Component Value Date   ALT 307 (H) 05/06/2017   AST 181 (H) 05/06/2017   ALKPHOS 390 (H) 05/06/2017   BILITOT 8.8 (H) 05/06/2017      RADIOGRAPHY: No results found.     IMPRESSION/PLAN: 1. Stage IV Adenoacarcinoma of the pancreas. Dr. Lisbeth Renshaw discusses the pathology findings, her course to date, and the options for palliative radiotherapy. We discussed the risks, benefits, short, and long term effects of radiotherapy, and the patient is interested in proceeding. Dr. Lisbeth Renshaw discusses the delivery and logistics of radiotherapy and anticipates a course of 2 weeks of treatment. She will simulate today. Written consent is obtained and placed in the chart, a copy was provided to the patient. We anticipate starting next Tuesday. She will resume systemic agents in [place] following completion of radiation. 2. Nausea secondary to #1. The patient will keep [place] informed if she needs additional antiemetics.   In a visit lasting 45 minutes, greater than 50% of the time was spent face to face discussing her prior treatment, and coordinating the patient's care.   The above documentation reflects my direct findings during this shared patient visit. Please see the separate note by Dr. Lisbeth Renshaw on this date for the remainder of the patient's plan of care.    Carola Rhine, PAC

## 2017-10-15 ENCOUNTER — Other Ambulatory Visit: Payer: Self-pay | Admitting: *Deleted

## 2017-10-15 NOTE — Patient Outreach (Signed)
Megan Jordan [facility]) [place] Management  10/15/2017  Megan Jordan 1956-03-19 [id number]   No response from patient outreach attempts will proceed with case closure.    Objective: Per KPN (Knowledge Performance Now, point of Jordan tool) and chart review, patient hospitalized 09/20/17 - (discharge unknown) at [place],. Per 06/16/17 UMR CM Call update, patient hospitalized 05/16/2017 - 05/24/2017 at [facility] new diagnosis of pancreatic head adenocarcinoma. Status post laparoscopic liver biopsy confirming metastatic disease. Patient was adirect admit for ERCP to evaluate pancreatic mass, biliary dilatation, jaundice. ERCP aborted, biliary stent placed. Current status: Stable, home. Will have port placed in near future, likely chemo to start once healed. [facility] Jordan Management Transition of Jordan follow up completed on 06/20/17.    Assessment: Received UMR Transition of Jordan referral on 09/27/17. Transition of Jordan follow up not completed due to unable to contact patient and will proceed with case closure.     Plan:RNCM will send case closure due to unable to reach request to Megan Jordan at [place] Management.      Megan Jordan H. Annia Friendly, BSN, [place] Management [facility] Telephonic CM Phone: [phone] Fax: [phone]

## 2017-10-17 ENCOUNTER — Ambulatory Visit
Admission: RE | Admit: 2017-10-17 | Discharge: 2017-10-17 | Disposition: A | Discharge status: Home / Self Care | Payer: 59 | Source: Ambulatory Visit | Attending: Radiation Oncology | Admitting: Radiation Oncology

## 2017-10-17 DIAGNOSIS — Z510 Encounter for antineoplastic radiation therapy: Secondary | ICD-10-CM | POA: Diagnosis not present

## 2017-10-17 DIAGNOSIS — Z9689 Presence of other specified functional implants: Secondary | ICD-10-CM | POA: Diagnosis not present

## 2017-10-17 DIAGNOSIS — C250 Malignant neoplasm of head of pancreas: Secondary | ICD-10-CM | POA: Diagnosis not present

## 2017-10-17 DIAGNOSIS — C787 Secondary malignant neoplasm of liver and intrahepatic bile duct: Secondary | ICD-10-CM | POA: Diagnosis not present

## 2017-10-18 ENCOUNTER — Ambulatory Visit
Admission: RE | Admit: 2017-10-18 | Discharge: 2017-10-18 | Disposition: A | Discharge status: Home / Self Care | Payer: 59 | Source: Ambulatory Visit | Attending: Radiation Oncology | Admitting: Radiation Oncology

## 2017-10-18 DIAGNOSIS — Z510 Encounter for antineoplastic radiation therapy: Secondary | ICD-10-CM | POA: Diagnosis not present

## 2017-10-18 DIAGNOSIS — C250 Malignant neoplasm of head of pancreas: Secondary | ICD-10-CM

## 2017-10-18 DIAGNOSIS — Z9689 Presence of other specified functional implants: Secondary | ICD-10-CM | POA: Diagnosis not present

## 2017-10-18 DIAGNOSIS — C787 Secondary malignant neoplasm of liver and intrahepatic bile duct: Secondary | ICD-10-CM | POA: Diagnosis not present

## 2017-10-18 NOTE — Progress Notes (Signed)
Patient education done, pancreas,  Radiation therapy and you book, my business card given , discussed  Side effects, skin irritation mild, use lotion after rad tx if irritated or itchy, nausea use compazine before rad txs, may need to eat 5-6 smaller meals instead of 3, imodium,low fiber diet for diarrhea, no fresh vegs, or fresh fruit if having diarrhea, luke warm shower or bath, unscented mild soap, no ruibbing,scrubbing or scratching area on abdomen, pat dry, stay hydrated,increase protein in diet, sees MD weekly and prn, only 10 rad txs, teach back given 1:57 PM

## 2017-10-19 ENCOUNTER — Ambulatory Visit
Admission: RE | Admit: 2017-10-19 | Discharge: 2017-10-19 | Disposition: A | Discharge status: Home / Self Care | Payer: 59 | Source: Ambulatory Visit | Attending: Radiation Oncology | Admitting: Radiation Oncology

## 2017-10-19 ENCOUNTER — Telehealth: Payer: Self-pay | Admitting: Radiation Oncology

## 2017-10-19 DIAGNOSIS — Z510 Encounter for antineoplastic radiation therapy: Secondary | ICD-10-CM | POA: Diagnosis not present

## 2017-10-19 DIAGNOSIS — Z9689 Presence of other specified functional implants: Secondary | ICD-10-CM | POA: Diagnosis not present

## 2017-10-19 DIAGNOSIS — C250 Malignant neoplasm of head of pancreas: Secondary | ICD-10-CM | POA: Diagnosis not present

## 2017-10-19 DIAGNOSIS — C787 Secondary malignant neoplasm of liver and intrahepatic bile duct: Secondary | ICD-10-CM | POA: Diagnosis not present

## 2017-10-19 MED FILL — MORPHINE SULF 10 MG/5 ML SO: 10 | 12 days supply | Qty: 240 | Fill #0

## 2017-10-19 NOTE — Telephone Encounter (Signed)
I tried calling her today but her VM box is full.

## 2017-10-20 ENCOUNTER — Ambulatory Visit
Admission: RE | Admit: 2017-10-20 | Discharge: 2017-10-20 | Disposition: A | Discharge status: Home / Self Care | Payer: 59 | Source: Ambulatory Visit | Attending: Radiation Oncology | Admitting: Radiation Oncology

## 2017-10-20 DIAGNOSIS — Z510 Encounter for antineoplastic radiation therapy: Secondary | ICD-10-CM | POA: Diagnosis not present

## 2017-10-20 DIAGNOSIS — C787 Secondary malignant neoplasm of liver and intrahepatic bile duct: Secondary | ICD-10-CM | POA: Diagnosis not present

## 2017-10-20 DIAGNOSIS — Z9689 Presence of other specified functional implants: Secondary | ICD-10-CM | POA: Diagnosis not present

## 2017-10-20 DIAGNOSIS — C250 Malignant neoplasm of head of pancreas: Secondary | ICD-10-CM | POA: Diagnosis not present

## 2017-10-21 ENCOUNTER — Ambulatory Visit
Admission: RE | Admit: 2017-10-21 | Discharge: 2017-10-21 | Disposition: A | Discharge status: Home / Self Care | Payer: 59 | Source: Ambulatory Visit | Attending: Radiation Oncology | Admitting: Radiation Oncology

## 2017-10-21 DIAGNOSIS — Z510 Encounter for antineoplastic radiation therapy: Secondary | ICD-10-CM | POA: Diagnosis not present

## 2017-10-21 DIAGNOSIS — Z9689 Presence of other specified functional implants: Secondary | ICD-10-CM | POA: Diagnosis not present

## 2017-10-21 DIAGNOSIS — C787 Secondary malignant neoplasm of liver and intrahepatic bile duct: Secondary | ICD-10-CM | POA: Diagnosis not present

## 2017-10-21 DIAGNOSIS — C250 Malignant neoplasm of head of pancreas: Secondary | ICD-10-CM | POA: Diagnosis not present

## 2017-10-23 ENCOUNTER — Ambulatory Visit
Admission: RE | Admit: 2017-10-23 | Discharge: 2017-10-23 | Disposition: A | Discharge status: Home / Self Care | Payer: 59 | Source: Ambulatory Visit | Attending: Radiation Oncology | Admitting: Radiation Oncology

## 2017-10-23 DIAGNOSIS — C250 Malignant neoplasm of head of pancreas: Secondary | ICD-10-CM | POA: Diagnosis not present

## 2017-10-23 DIAGNOSIS — C787 Secondary malignant neoplasm of liver and intrahepatic bile duct: Secondary | ICD-10-CM | POA: Diagnosis not present

## 2017-10-23 DIAGNOSIS — Z9689 Presence of other specified functional implants: Secondary | ICD-10-CM | POA: Diagnosis not present

## 2017-10-23 DIAGNOSIS — Z510 Encounter for antineoplastic radiation therapy: Secondary | ICD-10-CM | POA: Diagnosis not present

## 2017-10-24 ENCOUNTER — Ambulatory Visit: Payer: 59 | Admitting: Radiation Oncology

## 2017-10-24 ENCOUNTER — Telehealth: Payer: Self-pay | Admitting: *Deleted

## 2017-10-24 ENCOUNTER — Ambulatory Visit
Admission: RE | Admit: 2017-10-24 | Discharge: 2017-10-24 | Disposition: A | Discharge status: Home / Self Care | Payer: 59 | Source: Ambulatory Visit | Attending: Radiation Oncology | Admitting: Radiation Oncology

## 2017-10-24 DIAGNOSIS — C250 Malignant neoplasm of head of pancreas: Secondary | ICD-10-CM | POA: Diagnosis not present

## 2017-10-24 DIAGNOSIS — Z510 Encounter for antineoplastic radiation therapy: Secondary | ICD-10-CM | POA: Diagnosis not present

## 2017-10-24 DIAGNOSIS — C787 Secondary malignant neoplasm of liver and intrahepatic bile duct: Secondary | ICD-10-CM | POA: Diagnosis not present

## 2017-10-24 DIAGNOSIS — Z9689 Presence of other specified functional implants: Secondary | ICD-10-CM | POA: Diagnosis not present

## 2017-10-24 MED FILL — LORazepam 1 MG TABS: 1 | 30 days supply | Qty: 60 | Fill #0

## 2017-10-24 NOTE — Telephone Encounter (Signed)
Called rx ativan in to [facility] , 0.5mg  tab 1 tab oral q 4-6 hr prn nausea, no refills, 30 tabs 12:04 PM

## 2017-10-25 ENCOUNTER — Ambulatory Visit
Admission: RE | Admit: 2017-10-25 | Discharge: 2017-10-25 | Disposition: A | Discharge status: Home / Self Care | Payer: 59 | Source: Ambulatory Visit | Attending: Radiation Oncology | Admitting: Radiation Oncology

## 2017-10-25 DIAGNOSIS — C787 Secondary malignant neoplasm of liver and intrahepatic bile duct: Secondary | ICD-10-CM | POA: Diagnosis not present

## 2017-10-25 DIAGNOSIS — Z9689 Presence of other specified functional implants: Secondary | ICD-10-CM | POA: Diagnosis not present

## 2017-10-25 DIAGNOSIS — C250 Malignant neoplasm of head of pancreas: Secondary | ICD-10-CM | POA: Diagnosis not present

## 2017-10-25 DIAGNOSIS — Z510 Encounter for antineoplastic radiation therapy: Secondary | ICD-10-CM | POA: Diagnosis not present

## 2017-10-26 ENCOUNTER — Ambulatory Visit
Admission: RE | Admit: 2017-10-26 | Discharge: 2017-10-26 | Disposition: A | Discharge status: Home / Self Care | Payer: 59 | Source: Ambulatory Visit | Attending: Radiation Oncology | Admitting: Radiation Oncology

## 2017-10-26 DIAGNOSIS — C250 Malignant neoplasm of head of pancreas: Secondary | ICD-10-CM | POA: Diagnosis not present

## 2017-10-26 DIAGNOSIS — Z510 Encounter for antineoplastic radiation therapy: Secondary | ICD-10-CM | POA: Diagnosis not present

## 2017-10-26 DIAGNOSIS — Z9689 Presence of other specified functional implants: Secondary | ICD-10-CM | POA: Diagnosis not present

## 2017-10-26 DIAGNOSIS — C787 Secondary malignant neoplasm of liver and intrahepatic bile duct: Secondary | ICD-10-CM | POA: Diagnosis not present

## 2017-10-30 DIAGNOSIS — C250 Malignant neoplasm of head of pancreas: Secondary | ICD-10-CM | POA: Insufficient documentation

## 2017-10-30 NOTE — Progress Notes (Signed)
  Radiation Oncology         (336) [phone] ________________________________  Name: Megan Jordan MRN: [id number]  Date: 10/13/2017  DOB: [birth year]  SIMULATION AND TREATMENT PLANNING NOTE  DIAGNOSIS:     ICD-10-CM   1. 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  Identity was confirmed.  All relevant records and images related to the planned course of therapy were reviewed.   Written consent to proceed with treatment was confirmed which was freely given after reviewing the details related to the planned course of therapy had been reviewed with the patient.  Then, the patient was set-up in a stable reproducible  supine position for radiation therapy.  CT images were obtained.  Surface markings were placed.    Medically necessary complex treatment device(s) for immobilization: Customized VAC lock bag.   The CT images were loaded into the planning software.  Then the target and avoidance structures were contoured.  Treatment planning then occurred.  The radiation prescription was entered and confirmed.  A total of 4 complex treatment devices were fabricated which relate to the designed radiation treatment fields. Each of these customized fields/ complex treatment devices will be used on a daily basis during the radiation course. I have requested : 3D Simulation  I have requested a DVH of the following structures: Target volume, left kidney, right kidney, spinal cord.   The patient will undergo daily image guidance to ensure accurate localization of the target, and adequate minimize dose to the normal surrounding structures in close proximity to the target.   PLAN:  The patient will receive 30 Gy in 10 fractions.  ________________________________   Jodelle Gross, MD, PhD

## 2017-10-31 ENCOUNTER — Encounter: Payer: Self-pay | Admitting: Radiation Oncology

## 2017-10-31 ENCOUNTER — Ambulatory Visit
Admission: RE | Admit: 2017-10-31 | Discharge: 2017-10-31 | Disposition: A | Discharge status: Home / Self Care | Payer: 59 | Source: Ambulatory Visit | Attending: Radiation Oncology | Admitting: Radiation Oncology

## 2017-10-31 DIAGNOSIS — Z510 Encounter for antineoplastic radiation therapy: Secondary | ICD-10-CM | POA: Diagnosis not present

## 2017-10-31 DIAGNOSIS — Z9689 Presence of other specified functional implants: Secondary | ICD-10-CM | POA: Diagnosis not present

## 2017-10-31 DIAGNOSIS — C250 Malignant neoplasm of head of pancreas: Secondary | ICD-10-CM | POA: Diagnosis not present

## 2017-10-31 DIAGNOSIS — C787 Secondary malignant neoplasm of liver and intrahepatic bile duct: Secondary | ICD-10-CM | POA: Diagnosis not present

## 2017-11-01 ENCOUNTER — Ambulatory Visit: Payer: 59

## 2017-11-02 ENCOUNTER — Ambulatory Visit: Payer: 59

## 2017-11-02 DIAGNOSIS — G4700 Insomnia, unspecified: Secondary | ICD-10-CM | POA: Diagnosis not present

## 2017-11-02 DIAGNOSIS — G893 Neoplasm related pain (acute) (chronic): Secondary | ICD-10-CM | POA: Diagnosis not present

## 2017-11-02 DIAGNOSIS — C259 Malignant neoplasm of pancreas, unspecified: Secondary | ICD-10-CM | POA: Diagnosis not present

## 2017-11-02 DIAGNOSIS — R110 Nausea: Secondary | ICD-10-CM | POA: Diagnosis not present

## 2017-11-02 DIAGNOSIS — K3184 Gastroparesis: Secondary | ICD-10-CM | POA: Diagnosis not present

## 2017-11-02 MED FILL — MORPHINE SULF ER 15 MG TAB: 15 | 30 days supply | Qty: 90 | Fill #0

## 2017-11-02 MED FILL — MIRTAZAPINE 15 MG TAB: 15 | 30 days supply | Qty: 30 | Fill #0

## 2017-11-02 MED FILL — MORPHINE SULFATE IR 15 MG T: 15 | 30 days supply | Qty: 180 | Fill #0

## 2017-11-03 ENCOUNTER — Ambulatory Visit: Payer: 59

## 2017-11-04 ENCOUNTER — Ambulatory Visit: Payer: 59

## 2017-11-07 ENCOUNTER — Ambulatory Visit: Payer: 59

## 2017-11-08 ENCOUNTER — Ambulatory Visit: Payer: 59

## 2017-11-08 NOTE — Progress Notes (Signed)
  Radiation Oncology         (336) [phone] ________________________________  Name: Megan Jordan MRN: [id number]  Date: 10/31/2017  DOB: 21-Jan-1956  End of Treatment Note  Diagnosis:   The encounter diagnosis was Malignant neoplasm of head of pancreas ([place]).    Indication for treatment:  Palliative       Radiation treatment dates:  10/17/17 - 10/31/17   Site/dose:  Abdomen 30 Gy in 10 fx  Beams/energy:   Photon// 3D  Narrative: The patient tolerated radiation treatment relatively well.   Patient complained of nausea and pain, has been taking medication that helps with pain. She states that she feels dehydrated, and poor of appetite. She stated having fatigue, and diarrhea x3, she stated that she will have Immodium. Denies any issues with her skin or vommiting.   Plan: The patient has completed radiation treatment. The patient will return to radiation oncology clinic for routine followup in one month. I advised them to call or return sooner if they have any questions or concerns related to their recovery or treatment.  ------------------------------------------------  Jodelle Gross, MD, PhD  This document serves as a record of services personally performed by Kyung Rudd MD. It was created on his behalf by Delton Coombes, a trained medical scribe. The creation of this record is based on the scribe's personal observations and the provider's statements to them.

## 2017-11-09 ENCOUNTER — Ambulatory Visit: Payer: 59

## 2017-11-10 ENCOUNTER — Ambulatory Visit: Payer: 59

## 2017-11-11 ENCOUNTER — Ambulatory Visit: Payer: 59

## 2017-11-14 ENCOUNTER — Ambulatory Visit: Payer: 59

## 2017-11-15 DIAGNOSIS — Z5111 Encounter for antineoplastic chemotherapy: Secondary | ICD-10-CM | POA: Diagnosis not present

## 2017-11-15 DIAGNOSIS — R918 Other nonspecific abnormal finding of lung field: Secondary | ICD-10-CM | POA: Diagnosis not present

## 2017-11-15 DIAGNOSIS — K7689 Other specified diseases of liver: Secondary | ICD-10-CM | POA: Diagnosis not present

## 2017-11-15 DIAGNOSIS — C259 Malignant neoplasm of pancreas, unspecified: Secondary | ICD-10-CM | POA: Diagnosis not present

## 2017-11-16 DIAGNOSIS — F419 Anxiety disorder, unspecified: Secondary | ICD-10-CM | POA: Diagnosis not present

## 2017-11-16 DIAGNOSIS — G893 Neoplasm related pain (acute) (chronic): Secondary | ICD-10-CM | POA: Diagnosis not present

## 2017-11-16 DIAGNOSIS — D649 Anemia, unspecified: Secondary | ICD-10-CM | POA: Diagnosis not present

## 2017-11-16 DIAGNOSIS — C259 Malignant neoplasm of pancreas, unspecified: Secondary | ICD-10-CM | POA: Diagnosis not present

## 2017-11-16 DIAGNOSIS — Z5111 Encounter for antineoplastic chemotherapy: Secondary | ICD-10-CM | POA: Diagnosis not present

## 2017-11-16 MED FILL — POTASSIUM CL ER 20 MEQ TAB: 20 | 7 days supply | Qty: 14 | Fill #0

## 2017-11-16 MED FILL — ASPIRIN ADULT LOW STRENGTH: 81 | 30 days supply | Qty: 30 | Fill #0

## 2017-11-22 MED FILL — FUROSEMIDE 20 MG TABS: 20 | 30 days supply | Qty: 30 | Fill #0

## 2017-11-30 DIAGNOSIS — G893 Neoplasm related pain (acute) (chronic): Secondary | ICD-10-CM | POA: Diagnosis not present

## 2017-11-30 DIAGNOSIS — R530 Neoplastic (malignant) related fatigue: Secondary | ICD-10-CM | POA: Diagnosis not present

## 2017-11-30 DIAGNOSIS — K5900 Constipation, unspecified: Secondary | ICD-10-CM | POA: Diagnosis not present

## 2017-11-30 MED FILL — MORPHINE SULF ER 15 MG TAB: 15 | 30 days supply | Qty: 90 | Fill #0

## 2017-11-30 MED FILL — MORPHINE SULFATE IR 15 MG T: 15 | 30 days supply | Qty: 180 | Fill #0

## 2017-11-30 MED FILL — MIRTAZAPINE 15 MG TAB: 15 | 30 days supply | Qty: 30 | Fill #0

## 2017-12-08 ENCOUNTER — Telehealth: Payer: Self-pay | Admitting: *Deleted

## 2017-12-08 NOTE — Telephone Encounter (Signed)
Called patient to inform that the provider wants to see her on 12-14-17 @ 8:30 am due to being in the breast clinic with Dr. Sondra Come on 12-14-17 in the pm, lvm for a return call

## 2017-12-14 ENCOUNTER — Inpatient Hospital Stay: Admission: RE | Admit: 2017-12-14 | Payer: Self-pay | Source: Ambulatory Visit | Admitting: Radiation Oncology

## 2017-12-14 DIAGNOSIS — C259 Malignant neoplasm of pancreas, unspecified: Secondary | ICD-10-CM | POA: Diagnosis not present

## 2017-12-14 DIAGNOSIS — G893 Neoplasm related pain (acute) (chronic): Secondary | ICD-10-CM | POA: Diagnosis not present

## 2017-12-14 DIAGNOSIS — C787 Secondary malignant neoplasm of liver and intrahepatic bile duct: Secondary | ICD-10-CM | POA: Diagnosis not present

## 2017-12-14 DIAGNOSIS — D649 Anemia, unspecified: Secondary | ICD-10-CM | POA: Diagnosis not present

## 2017-12-15 MED FILL — SM CLEARLAX POWDER: 31 days supply | Qty: 527 | Fill #2

## 2017-12-16 DIAGNOSIS — C259 Malignant neoplasm of pancreas, unspecified: Secondary | ICD-10-CM | POA: Diagnosis not present

## 2017-12-16 DIAGNOSIS — K8689 Other specified diseases of pancreas: Secondary | ICD-10-CM | POA: Diagnosis not present

## 2017-12-16 DIAGNOSIS — D509 Iron deficiency anemia, unspecified: Secondary | ICD-10-CM | POA: Diagnosis not present

## 2017-12-19 DIAGNOSIS — C259 Malignant neoplasm of pancreas, unspecified: Secondary | ICD-10-CM | POA: Diagnosis not present

## 2017-12-19 DIAGNOSIS — K8689 Other specified diseases of pancreas: Secondary | ICD-10-CM | POA: Diagnosis not present

## 2017-12-19 DIAGNOSIS — D509 Iron deficiency anemia, unspecified: Secondary | ICD-10-CM | POA: Diagnosis not present

## 2017-12-20 DIAGNOSIS — Z5111 Encounter for antineoplastic chemotherapy: Secondary | ICD-10-CM | POA: Diagnosis not present

## 2017-12-20 DIAGNOSIS — C259 Malignant neoplasm of pancreas, unspecified: Secondary | ICD-10-CM | POA: Diagnosis not present

## 2017-12-29 DIAGNOSIS — G893 Neoplasm related pain (acute) (chronic): Secondary | ICD-10-CM | POA: Diagnosis not present

## 2017-12-29 DIAGNOSIS — K5900 Constipation, unspecified: Secondary | ICD-10-CM | POA: Diagnosis not present

## 2017-12-29 MED FILL — MORPHINE SULFATE IR 15 MG T: 15 | 30 days supply | Qty: 180 | Fill #0

## 2017-12-30 MED FILL — MORPHINE SULFATE ER 30 MG C: 30 | 45 days supply | Qty: 90 | Fill #0

## 2018-01-03 DIAGNOSIS — E876 Hypokalemia: Secondary | ICD-10-CM | POA: Diagnosis not present

## 2018-01-03 DIAGNOSIS — C259 Malignant neoplasm of pancreas, unspecified: Secondary | ICD-10-CM | POA: Diagnosis not present

## 2018-01-04 ENCOUNTER — Ambulatory Visit
Admission: RE | Admit: 2018-01-04 | Payer: No Typology Code available for payment source | Source: Ambulatory Visit | Admitting: Radiation Oncology

## 2018-01-04 DIAGNOSIS — C259 Malignant neoplasm of pancreas, unspecified: Secondary | ICD-10-CM | POA: Diagnosis not present

## 2018-01-04 DIAGNOSIS — Z5111 Encounter for antineoplastic chemotherapy: Secondary | ICD-10-CM | POA: Diagnosis not present

## 2018-01-04 MED FILL — LIDOCAINE-PRILOCAINE CREAM: 2.5-2.5 | 14 days supply | Qty: 30 | Fill #0

## 2018-01-06 MED FILL — OMEPRAZOLE 20 MG CAP: 20 | 30 days supply | Qty: 30 | Fill #0

## 2018-01-17 DIAGNOSIS — C259 Malignant neoplasm of pancreas, unspecified: Secondary | ICD-10-CM | POA: Diagnosis not present

## 2018-01-17 DIAGNOSIS — C787 Secondary malignant neoplasm of liver and intrahepatic bile duct: Secondary | ICD-10-CM | POA: Diagnosis not present

## 2018-01-17 DIAGNOSIS — R410 Disorientation, unspecified: Secondary | ICD-10-CM | POA: Diagnosis not present

## 2018-01-17 DIAGNOSIS — Z515 Encounter for palliative care: Secondary | ICD-10-CM | POA: Diagnosis not present

## 2018-01-18 DIAGNOSIS — C786 Secondary malignant neoplasm of retroperitoneum and peritoneum: Secondary | ICD-10-CM | POA: Diagnosis not present

## 2018-01-18 DIAGNOSIS — C259 Malignant neoplasm of pancreas, unspecified: Secondary | ICD-10-CM | POA: Diagnosis not present

## 2018-01-18 DIAGNOSIS — K219 Gastro-esophageal reflux disease without esophagitis: Secondary | ICD-10-CM | POA: Diagnosis not present

## 2018-01-18 DIAGNOSIS — C787 Secondary malignant neoplasm of liver and intrahepatic bile duct: Secondary | ICD-10-CM | POA: Diagnosis not present

## 2018-01-18 DIAGNOSIS — I10 Essential (primary) hypertension: Secondary | ICD-10-CM | POA: Diagnosis not present

## 2018-01-19 DIAGNOSIS — C786 Secondary malignant neoplasm of retroperitoneum and peritoneum: Secondary | ICD-10-CM | POA: Diagnosis not present

## 2018-01-19 DIAGNOSIS — C787 Secondary malignant neoplasm of liver and intrahepatic bile duct: Secondary | ICD-10-CM | POA: Diagnosis not present

## 2018-01-19 DIAGNOSIS — C259 Malignant neoplasm of pancreas, unspecified: Secondary | ICD-10-CM | POA: Diagnosis not present

## 2018-01-19 DIAGNOSIS — I10 Essential (primary) hypertension: Secondary | ICD-10-CM | POA: Diagnosis not present

## 2018-01-19 DIAGNOSIS — K219 Gastro-esophageal reflux disease without esophagitis: Secondary | ICD-10-CM | POA: Diagnosis not present

## 2018-01-19 MED FILL — TEMAZEPAM 15 MG CAPSULE: 15 | 30 days supply | Qty: 30 | Fill #0

## 2018-01-20 DIAGNOSIS — C786 Secondary malignant neoplasm of retroperitoneum and peritoneum: Secondary | ICD-10-CM | POA: Diagnosis not present

## 2018-01-20 DIAGNOSIS — I10 Essential (primary) hypertension: Secondary | ICD-10-CM | POA: Diagnosis not present

## 2018-01-20 DIAGNOSIS — C259 Malignant neoplasm of pancreas, unspecified: Secondary | ICD-10-CM | POA: Diagnosis not present

## 2018-01-20 DIAGNOSIS — C787 Secondary malignant neoplasm of liver and intrahepatic bile duct: Secondary | ICD-10-CM | POA: Diagnosis not present

## 2018-01-20 DIAGNOSIS — K219 Gastro-esophageal reflux disease without esophagitis: Secondary | ICD-10-CM | POA: Diagnosis not present

## 2018-01-21 DIAGNOSIS — C259 Malignant neoplasm of pancreas, unspecified: Secondary | ICD-10-CM | POA: Diagnosis not present

## 2018-01-21 DIAGNOSIS — C787 Secondary malignant neoplasm of liver and intrahepatic bile duct: Secondary | ICD-10-CM | POA: Diagnosis not present

## 2018-01-21 DIAGNOSIS — I10 Essential (primary) hypertension: Secondary | ICD-10-CM | POA: Diagnosis not present

## 2018-01-21 DIAGNOSIS — K219 Gastro-esophageal reflux disease without esophagitis: Secondary | ICD-10-CM | POA: Diagnosis not present

## 2018-01-21 DIAGNOSIS — C786 Secondary malignant neoplasm of retroperitoneum and peritoneum: Secondary | ICD-10-CM | POA: Diagnosis not present

## 2018-01-22 DIAGNOSIS — I10 Essential (primary) hypertension: Secondary | ICD-10-CM | POA: Diagnosis not present

## 2018-01-22 DIAGNOSIS — K219 Gastro-esophageal reflux disease without esophagitis: Secondary | ICD-10-CM | POA: Diagnosis not present

## 2018-01-22 DIAGNOSIS — C786 Secondary malignant neoplasm of retroperitoneum and peritoneum: Secondary | ICD-10-CM | POA: Diagnosis not present

## 2018-01-22 DIAGNOSIS — C259 Malignant neoplasm of pancreas, unspecified: Secondary | ICD-10-CM | POA: Diagnosis not present

## 2018-01-22 DIAGNOSIS — C787 Secondary malignant neoplasm of liver and intrahepatic bile duct: Secondary | ICD-10-CM | POA: Diagnosis not present

## 2018-01-23 DIAGNOSIS — C787 Secondary malignant neoplasm of liver and intrahepatic bile duct: Secondary | ICD-10-CM | POA: Diagnosis not present

## 2018-01-23 DIAGNOSIS — I10 Essential (primary) hypertension: Secondary | ICD-10-CM | POA: Diagnosis not present

## 2018-01-23 DIAGNOSIS — K219 Gastro-esophageal reflux disease without esophagitis: Secondary | ICD-10-CM | POA: Diagnosis not present

## 2018-01-23 DIAGNOSIS — C786 Secondary malignant neoplasm of retroperitoneum and peritoneum: Secondary | ICD-10-CM | POA: Diagnosis not present

## 2018-01-23 DIAGNOSIS — C259 Malignant neoplasm of pancreas, unspecified: Secondary | ICD-10-CM | POA: Diagnosis not present

## 2018-01-24 DIAGNOSIS — C259 Malignant neoplasm of pancreas, unspecified: Secondary | ICD-10-CM | POA: Diagnosis not present

## 2018-01-24 DIAGNOSIS — I10 Essential (primary) hypertension: Secondary | ICD-10-CM | POA: Diagnosis not present

## 2018-01-24 DIAGNOSIS — K219 Gastro-esophageal reflux disease without esophagitis: Secondary | ICD-10-CM | POA: Diagnosis not present

## 2018-01-24 DIAGNOSIS — C786 Secondary malignant neoplasm of retroperitoneum and peritoneum: Secondary | ICD-10-CM | POA: Diagnosis not present

## 2018-01-24 DIAGNOSIS — C787 Secondary malignant neoplasm of liver and intrahepatic bile duct: Secondary | ICD-10-CM | POA: Diagnosis not present

## 2018-01-25 DIAGNOSIS — I10 Essential (primary) hypertension: Secondary | ICD-10-CM | POA: Diagnosis not present

## 2018-01-25 DIAGNOSIS — K219 Gastro-esophageal reflux disease without esophagitis: Secondary | ICD-10-CM | POA: Diagnosis not present

## 2018-01-25 DIAGNOSIS — C786 Secondary malignant neoplasm of retroperitoneum and peritoneum: Secondary | ICD-10-CM | POA: Diagnosis not present

## 2018-01-25 DIAGNOSIS — C787 Secondary malignant neoplasm of liver and intrahepatic bile duct: Secondary | ICD-10-CM | POA: Diagnosis not present

## 2018-01-25 DIAGNOSIS — C259 Malignant neoplasm of pancreas, unspecified: Secondary | ICD-10-CM | POA: Diagnosis not present

## 2018-01-26 DIAGNOSIS — G8929 Other chronic pain: Secondary | ICD-10-CM | POA: Diagnosis not present

## 2018-01-26 DIAGNOSIS — C786 Secondary malignant neoplasm of retroperitoneum and peritoneum: Secondary | ICD-10-CM | POA: Diagnosis not present

## 2018-01-26 DIAGNOSIS — M6281 Muscle weakness (generalized): Secondary | ICD-10-CM | POA: Diagnosis not present

## 2018-01-26 DIAGNOSIS — K219 Gastro-esophageal reflux disease without esophagitis: Secondary | ICD-10-CM | POA: Diagnosis not present

## 2018-01-26 DIAGNOSIS — C787 Secondary malignant neoplasm of liver and intrahepatic bile duct: Secondary | ICD-10-CM | POA: Diagnosis not present

## 2018-01-26 DIAGNOSIS — C259 Malignant neoplasm of pancreas, unspecified: Secondary | ICD-10-CM | POA: Diagnosis not present

## 2018-01-26 DIAGNOSIS — K5909 Other constipation: Secondary | ICD-10-CM | POA: Diagnosis not present

## 2018-01-26 DIAGNOSIS — I10 Essential (primary) hypertension: Secondary | ICD-10-CM | POA: Diagnosis not present

## 2018-01-26 DIAGNOSIS — G4700 Insomnia, unspecified: Secondary | ICD-10-CM | POA: Diagnosis not present

## 2018-01-26 MED FILL — MORPHINE SULFATE IR 15 MG T: 15 | 10 days supply | Qty: 60 | Fill #0

## 2018-01-27 DIAGNOSIS — C787 Secondary malignant neoplasm of liver and intrahepatic bile duct: Secondary | ICD-10-CM | POA: Diagnosis not present

## 2018-01-27 DIAGNOSIS — C786 Secondary malignant neoplasm of retroperitoneum and peritoneum: Secondary | ICD-10-CM | POA: Diagnosis not present

## 2018-01-27 DIAGNOSIS — C259 Malignant neoplasm of pancreas, unspecified: Secondary | ICD-10-CM | POA: Diagnosis not present

## 2018-01-27 DIAGNOSIS — I10 Essential (primary) hypertension: Secondary | ICD-10-CM | POA: Diagnosis not present

## 2018-01-27 DIAGNOSIS — K219 Gastro-esophageal reflux disease without esophagitis: Secondary | ICD-10-CM | POA: Diagnosis not present

## 2018-01-28 DIAGNOSIS — I10 Essential (primary) hypertension: Secondary | ICD-10-CM | POA: Diagnosis not present

## 2018-01-28 DIAGNOSIS — C786 Secondary malignant neoplasm of retroperitoneum and peritoneum: Secondary | ICD-10-CM | POA: Diagnosis not present

## 2018-01-28 DIAGNOSIS — C787 Secondary malignant neoplasm of liver and intrahepatic bile duct: Secondary | ICD-10-CM | POA: Diagnosis not present

## 2018-01-28 DIAGNOSIS — C259 Malignant neoplasm of pancreas, unspecified: Secondary | ICD-10-CM | POA: Diagnosis not present

## 2018-01-28 DIAGNOSIS — K219 Gastro-esophageal reflux disease without esophagitis: Secondary | ICD-10-CM | POA: Diagnosis not present

## 2018-01-29 DIAGNOSIS — I10 Essential (primary) hypertension: Secondary | ICD-10-CM | POA: Diagnosis not present

## 2018-01-29 DIAGNOSIS — C786 Secondary malignant neoplasm of retroperitoneum and peritoneum: Secondary | ICD-10-CM | POA: Diagnosis not present

## 2018-01-29 DIAGNOSIS — C787 Secondary malignant neoplasm of liver and intrahepatic bile duct: Secondary | ICD-10-CM | POA: Diagnosis not present

## 2018-01-29 DIAGNOSIS — C259 Malignant neoplasm of pancreas, unspecified: Secondary | ICD-10-CM | POA: Diagnosis not present

## 2018-01-29 DIAGNOSIS — K219 Gastro-esophageal reflux disease without esophagitis: Secondary | ICD-10-CM | POA: Diagnosis not present

## 2018-01-30 DIAGNOSIS — C786 Secondary malignant neoplasm of retroperitoneum and peritoneum: Secondary | ICD-10-CM | POA: Diagnosis not present

## 2018-01-30 DIAGNOSIS — I10 Essential (primary) hypertension: Secondary | ICD-10-CM | POA: Diagnosis not present

## 2018-01-30 DIAGNOSIS — K219 Gastro-esophageal reflux disease without esophagitis: Secondary | ICD-10-CM | POA: Diagnosis not present

## 2018-01-30 DIAGNOSIS — C259 Malignant neoplasm of pancreas, unspecified: Secondary | ICD-10-CM | POA: Diagnosis not present

## 2018-01-30 DIAGNOSIS — C787 Secondary malignant neoplasm of liver and intrahepatic bile duct: Secondary | ICD-10-CM | POA: Diagnosis not present

## 2018-01-31 DIAGNOSIS — C786 Secondary malignant neoplasm of retroperitoneum and peritoneum: Secondary | ICD-10-CM | POA: Diagnosis not present

## 2018-01-31 DIAGNOSIS — I10 Essential (primary) hypertension: Secondary | ICD-10-CM | POA: Diagnosis not present

## 2018-01-31 DIAGNOSIS — C787 Secondary malignant neoplasm of liver and intrahepatic bile duct: Secondary | ICD-10-CM | POA: Diagnosis not present

## 2018-01-31 DIAGNOSIS — K219 Gastro-esophageal reflux disease without esophagitis: Secondary | ICD-10-CM | POA: Diagnosis not present

## 2018-01-31 DIAGNOSIS — C259 Malignant neoplasm of pancreas, unspecified: Secondary | ICD-10-CM | POA: Diagnosis not present

## 2018-02-01 DIAGNOSIS — C259 Malignant neoplasm of pancreas, unspecified: Secondary | ICD-10-CM | POA: Diagnosis not present

## 2018-02-01 DIAGNOSIS — I10 Essential (primary) hypertension: Secondary | ICD-10-CM | POA: Diagnosis not present

## 2018-02-01 DIAGNOSIS — K219 Gastro-esophageal reflux disease without esophagitis: Secondary | ICD-10-CM | POA: Diagnosis not present

## 2018-02-01 DIAGNOSIS — C786 Secondary malignant neoplasm of retroperitoneum and peritoneum: Secondary | ICD-10-CM | POA: Diagnosis not present

## 2018-02-01 DIAGNOSIS — C787 Secondary malignant neoplasm of liver and intrahepatic bile duct: Secondary | ICD-10-CM | POA: Diagnosis not present

## 2018-02-02 DIAGNOSIS — C787 Secondary malignant neoplasm of liver and intrahepatic bile duct: Secondary | ICD-10-CM | POA: Diagnosis not present

## 2018-02-02 DIAGNOSIS — C259 Malignant neoplasm of pancreas, unspecified: Secondary | ICD-10-CM | POA: Diagnosis not present

## 2018-02-02 DIAGNOSIS — K219 Gastro-esophageal reflux disease without esophagitis: Secondary | ICD-10-CM | POA: Diagnosis not present

## 2018-02-02 DIAGNOSIS — C786 Secondary malignant neoplasm of retroperitoneum and peritoneum: Secondary | ICD-10-CM | POA: Diagnosis not present

## 2018-02-02 DIAGNOSIS — I10 Essential (primary) hypertension: Secondary | ICD-10-CM | POA: Diagnosis not present

## 2018-02-02 MED FILL — SENNA PLUS 8.6-50 MG TAB: 8.6-50 | 16 days supply | Qty: 100 | Fill #0

## 2018-02-02 MED FILL — MAPAP ARTHRITIS 650 MG TAB: 650 | 30 days supply | Qty: 100 | Fill #0

## 2018-02-02 MED FILL — MORPHINE SULF 60 MG TAB SA: 60 | 30 days supply | Qty: 90 | Fill #0

## 2018-02-03 DIAGNOSIS — I10 Essential (primary) hypertension: Secondary | ICD-10-CM | POA: Diagnosis not present

## 2018-02-03 DIAGNOSIS — C787 Secondary malignant neoplasm of liver and intrahepatic bile duct: Secondary | ICD-10-CM | POA: Diagnosis not present

## 2018-02-03 DIAGNOSIS — C786 Secondary malignant neoplasm of retroperitoneum and peritoneum: Secondary | ICD-10-CM | POA: Diagnosis not present

## 2018-02-03 DIAGNOSIS — C259 Malignant neoplasm of pancreas, unspecified: Secondary | ICD-10-CM | POA: Diagnosis not present

## 2018-02-03 DIAGNOSIS — K219 Gastro-esophageal reflux disease without esophagitis: Secondary | ICD-10-CM | POA: Diagnosis not present

## 2018-02-04 DIAGNOSIS — K219 Gastro-esophageal reflux disease without esophagitis: Secondary | ICD-10-CM | POA: Diagnosis not present

## 2018-02-04 DIAGNOSIS — I10 Essential (primary) hypertension: Secondary | ICD-10-CM | POA: Diagnosis not present

## 2018-02-04 DIAGNOSIS — C787 Secondary malignant neoplasm of liver and intrahepatic bile duct: Secondary | ICD-10-CM | POA: Diagnosis not present

## 2018-02-04 DIAGNOSIS — C259 Malignant neoplasm of pancreas, unspecified: Secondary | ICD-10-CM | POA: Diagnosis not present

## 2018-02-04 DIAGNOSIS — C786 Secondary malignant neoplasm of retroperitoneum and peritoneum: Secondary | ICD-10-CM | POA: Diagnosis not present

## 2018-02-05 DIAGNOSIS — C787 Secondary malignant neoplasm of liver and intrahepatic bile duct: Secondary | ICD-10-CM | POA: Diagnosis not present

## 2018-02-05 DIAGNOSIS — K219 Gastro-esophageal reflux disease without esophagitis: Secondary | ICD-10-CM | POA: Diagnosis not present

## 2018-02-05 DIAGNOSIS — I10 Essential (primary) hypertension: Secondary | ICD-10-CM | POA: Diagnosis not present

## 2018-02-05 DIAGNOSIS — C786 Secondary malignant neoplasm of retroperitoneum and peritoneum: Secondary | ICD-10-CM | POA: Diagnosis not present

## 2018-02-05 DIAGNOSIS — C259 Malignant neoplasm of pancreas, unspecified: Secondary | ICD-10-CM | POA: Diagnosis not present

## 2018-02-06 DIAGNOSIS — C259 Malignant neoplasm of pancreas, unspecified: Secondary | ICD-10-CM | POA: Diagnosis not present

## 2018-02-06 DIAGNOSIS — C786 Secondary malignant neoplasm of retroperitoneum and peritoneum: Secondary | ICD-10-CM | POA: Diagnosis not present

## 2018-02-06 DIAGNOSIS — I10 Essential (primary) hypertension: Secondary | ICD-10-CM | POA: Diagnosis not present

## 2018-02-06 DIAGNOSIS — K219 Gastro-esophageal reflux disease without esophagitis: Secondary | ICD-10-CM | POA: Diagnosis not present

## 2018-02-06 DIAGNOSIS — C787 Secondary malignant neoplasm of liver and intrahepatic bile duct: Secondary | ICD-10-CM | POA: Diagnosis not present

## 2018-03-06 DEATH — deceased
